# Patient Record
Sex: Male | Born: 1950 | Race: White | Hispanic: No | Marital: Married | Smoking: Current every day smoker
Health system: Southern US, Community
[De-identification: ages and names within clinical notes are randomized; demographics above are authoritative.]

## PROBLEM LIST (undated history)

## (undated) DIAGNOSIS — M109 Gout, unspecified: Secondary | ICD-10-CM

## (undated) DIAGNOSIS — Z973 Presence of spectacles and contact lenses: Secondary | ICD-10-CM

## (undated) DIAGNOSIS — J302 Other seasonal allergic rhinitis: Secondary | ICD-10-CM

## (undated) DIAGNOSIS — E785 Hyperlipidemia, unspecified: Secondary | ICD-10-CM

## (undated) DIAGNOSIS — Z972 Presence of dental prosthetic device (complete) (partial): Secondary | ICD-10-CM

## (undated) DIAGNOSIS — I10 Essential (primary) hypertension: Secondary | ICD-10-CM

## (undated) DIAGNOSIS — G4730 Sleep apnea, unspecified: Secondary | ICD-10-CM

## (undated) DIAGNOSIS — E119 Type 2 diabetes mellitus without complications: Secondary | ICD-10-CM

## (undated) DIAGNOSIS — J449 Chronic obstructive pulmonary disease, unspecified: Secondary | ICD-10-CM

## (undated) DIAGNOSIS — K219 Gastro-esophageal reflux disease without esophagitis: Secondary | ICD-10-CM

## (undated) HISTORY — PX: TONSILLECTOMY: SUR1361

## (undated) HISTORY — DX: Essential (primary) hypertension: I10

## (undated) HISTORY — PX: MENISCUS REPAIR: SHX5179

## (undated) HISTORY — DX: Sleep apnea, unspecified: G47.30

## (undated) HISTORY — DX: Hyperlipidemia, unspecified: E78.5

## (undated) HISTORY — PX: COLONOSCOPY: SHX174

## (undated) HISTORY — DX: Chronic obstructive pulmonary disease, unspecified: J44.9

## (undated) HISTORY — DX: Other seasonal allergic rhinitis: J30.2

## (undated) HISTORY — PX: ERCP: SHX60

## (undated) HISTORY — PX: CARPAL TUNNEL RELEASE: SHX101

## (undated) HISTORY — DX: Type 2 diabetes mellitus without complications: E11.9

---

## 1996-04-13 HISTORY — PX: NASAL SINUS SURGERY: SHX719

## 1997-09-12 ENCOUNTER — Ambulatory Visit (HOSPITAL_BASED_OUTPATIENT_CLINIC_OR_DEPARTMENT_OTHER): Admission: RE | Admit: 1997-09-12 | Discharge: 1997-09-12 | Payer: Self-pay | Admitting: Otolaryngology

## 1998-06-21 ENCOUNTER — Ambulatory Visit: Admission: RE | Admit: 1998-06-21 | Discharge: 1998-06-21 | Payer: Self-pay | Admitting: Rheumatology

## 2000-11-02 ENCOUNTER — Encounter (INDEPENDENT_AMBULATORY_CARE_PROVIDER_SITE_OTHER): Payer: Self-pay | Admitting: Specialist

## 2000-11-02 ENCOUNTER — Other Ambulatory Visit: Admission: RE | Admit: 2000-11-02 | Discharge: 2000-11-02 | Payer: Self-pay | Admitting: Family Medicine

## 2002-01-13 ENCOUNTER — Encounter: Payer: Self-pay | Admitting: Gastroenterology

## 2002-01-13 ENCOUNTER — Encounter: Admission: RE | Admit: 2002-01-13 | Discharge: 2002-01-13 | Payer: Self-pay | Admitting: Gastroenterology

## 2002-01-20 ENCOUNTER — Encounter: Admission: RE | Admit: 2002-01-20 | Discharge: 2002-01-20 | Payer: Self-pay | Admitting: Gastroenterology

## 2002-01-20 ENCOUNTER — Encounter: Payer: Self-pay | Admitting: Gastroenterology

## 2002-01-31 ENCOUNTER — Ambulatory Visit (HOSPITAL_COMMUNITY): Admission: RE | Admit: 2002-01-31 | Discharge: 2002-01-31 | Payer: Self-pay | Admitting: Gastroenterology

## 2002-09-06 ENCOUNTER — Encounter (INDEPENDENT_AMBULATORY_CARE_PROVIDER_SITE_OTHER): Payer: Self-pay | Admitting: *Deleted

## 2002-09-06 ENCOUNTER — Ambulatory Visit (HOSPITAL_COMMUNITY): Admission: RE | Admit: 2002-09-06 | Discharge: 2002-09-06 | Payer: Self-pay | Admitting: Gastroenterology

## 2002-10-06 ENCOUNTER — Ambulatory Visit: Admission: RE | Admit: 2002-10-06 | Discharge: 2002-10-06 | Payer: Self-pay | Admitting: Otolaryngology

## 2005-11-13 ENCOUNTER — Encounter: Admission: RE | Admit: 2005-11-13 | Discharge: 2005-11-13 | Payer: Self-pay | Admitting: Gastroenterology

## 2005-11-20 ENCOUNTER — Encounter (INDEPENDENT_AMBULATORY_CARE_PROVIDER_SITE_OTHER): Payer: Self-pay | Admitting: Specialist

## 2005-11-20 ENCOUNTER — Ambulatory Visit (HOSPITAL_COMMUNITY): Admission: RE | Admit: 2005-11-20 | Discharge: 2005-11-20 | Payer: Self-pay | Admitting: Gastroenterology

## 2006-12-17 ENCOUNTER — Encounter: Admission: RE | Admit: 2006-12-17 | Discharge: 2006-12-17 | Payer: Self-pay | Admitting: Otolaryngology

## 2006-12-30 ENCOUNTER — Ambulatory Visit (HOSPITAL_COMMUNITY): Admission: RE | Admit: 2006-12-30 | Discharge: 2006-12-31 | Payer: Self-pay | Admitting: Otolaryngology

## 2006-12-30 ENCOUNTER — Encounter (INDEPENDENT_AMBULATORY_CARE_PROVIDER_SITE_OTHER): Payer: Self-pay | Admitting: Otolaryngology

## 2007-01-11 ENCOUNTER — Ambulatory Visit: Payer: Self-pay | Admitting: Internal Medicine

## 2007-02-11 ENCOUNTER — Ambulatory Visit: Payer: Self-pay | Admitting: Internal Medicine

## 2007-02-18 ENCOUNTER — Ambulatory Visit: Payer: Self-pay | Admitting: Pulmonary Disease

## 2007-02-19 DIAGNOSIS — E785 Hyperlipidemia, unspecified: Secondary | ICD-10-CM | POA: Insufficient documentation

## 2007-02-19 DIAGNOSIS — I10 Essential (primary) hypertension: Secondary | ICD-10-CM | POA: Insufficient documentation

## 2007-02-19 DIAGNOSIS — E1165 Type 2 diabetes mellitus with hyperglycemia: Secondary | ICD-10-CM | POA: Insufficient documentation

## 2007-02-19 DIAGNOSIS — R0602 Shortness of breath: Secondary | ICD-10-CM | POA: Insufficient documentation

## 2007-02-19 DIAGNOSIS — R059 Cough, unspecified: Secondary | ICD-10-CM | POA: Insufficient documentation

## 2007-02-19 DIAGNOSIS — R05 Cough: Secondary | ICD-10-CM

## 2007-03-08 ENCOUNTER — Telehealth (INDEPENDENT_AMBULATORY_CARE_PROVIDER_SITE_OTHER): Payer: Self-pay | Admitting: *Deleted

## 2007-03-17 ENCOUNTER — Encounter (INDEPENDENT_AMBULATORY_CARE_PROVIDER_SITE_OTHER): Payer: Self-pay | Admitting: Otolaryngology

## 2007-03-17 ENCOUNTER — Observation Stay (HOSPITAL_COMMUNITY): Admission: RE | Admit: 2007-03-17 | Discharge: 2007-03-18 | Payer: Self-pay | Admitting: Otolaryngology

## 2007-09-19 ENCOUNTER — Telehealth (INDEPENDENT_AMBULATORY_CARE_PROVIDER_SITE_OTHER): Payer: Self-pay | Admitting: *Deleted

## 2009-04-13 HISTORY — PX: HEMORRHOID SURGERY: SHX153

## 2009-07-12 ENCOUNTER — Encounter: Admission: RE | Admit: 2009-07-12 | Discharge: 2009-07-12 | Payer: Self-pay | Admitting: General Surgery

## 2009-07-17 ENCOUNTER — Ambulatory Visit (HOSPITAL_BASED_OUTPATIENT_CLINIC_OR_DEPARTMENT_OTHER): Admission: RE | Admit: 2009-07-17 | Discharge: 2009-07-17 | Payer: Self-pay | Admitting: General Surgery

## 2010-04-13 HISTORY — PX: PAROTID GLAND TUMOR EXCISION: SHX5221

## 2010-05-13 NOTE — Progress Notes (Signed)
Summary: [place] w/cxr pt called back please call pt's wife at work   Phone Note [employer] placed by: leslie Call placed to: pt Summary of Call: Aaccording to tickle file, pt is due for rov with cxr.  ATC pt NA LMOMTCB  Initial call taken by: Vernie Murders,  September 19, 2007 12:05 PM  Follow-up for Phone Call        lmtcb x2  Vernie Murders  September 20, 2007 11:15 AM   [phone] [place] - wife please call after 1:00 Follow-up by: Lacinda Axon,  September 20, 2007 12:13 PM  Additional Follow-up for Phone Call Additional follow up Details #1::        Spoke with pt.'s wife [place].  I made her aware that pt. is due for rov with cxr this month.  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 Additional Follow-up by: Vernie Murders,  September 21, 2007 10:05 AM

## 2010-05-13 NOTE — Progress Notes (Signed)
Summary: need face mask  Phone Note Call from Patient Call back at [phone]   Summary of Call: calling about getting face mask Patient's chart has been requested.  . Initial call taken by: Rickard Patience,  March 08, 2007 3:09 PM  Follow-up for Phone Call        ATCB LMOM TCB.  Rx for CPAP mask mailed out on 03/03/07 to Pt.  Spoke with pt's wife aware. Follow-up by: Cloyde Reams,  March 08, 2007 4:27 PM

## 2010-07-02 LAB — GLUCOSE, CAPILLARY
Glucose-Capillary: 113 mg/dL — ABNORMAL HIGH (ref 70–99)
Glucose-Capillary: 123 mg/dL — ABNORMAL HIGH (ref 70–99)

## 2010-07-02 LAB — DIFFERENTIAL
Basophils Absolute: 0.1 10*3/uL (ref 0.0–0.1)
Basophils Relative: 1 % (ref 0–1)
Eosinophils Absolute: 0.2 10*3/uL (ref 0.0–0.7)
Eosinophils Relative: 3 % (ref 0–5)
Lymphocytes Relative: 34 % (ref 12–46)
Lymphs Abs: 2.6 10*3/uL (ref 0.7–4.0)
Monocytes Absolute: 0.6 10*3/uL (ref 0.1–1.0)
Monocytes Relative: 8 % (ref 3–12)
Neutro Abs: 4.1 10*3/uL (ref 1.7–7.7)
Neutrophils Relative %: 54 % (ref 43–77)

## 2010-07-02 LAB — CBC
HCT: 44.4 % (ref 39.0–52.0)
Hemoglobin: 14.8 g/dL (ref 13.0–17.0)
MCHC: 33.3 g/dL (ref 30.0–36.0)
MCV: 89.2 fL (ref 78.0–100.0)
Platelets: 188 10*3/uL (ref 150–400)
RBC: 4.98 MIL/uL (ref 4.22–5.81)
RDW: 13.8 % (ref 11.5–15.5)
WBC: 7.6 10*3/uL (ref 4.0–10.5)

## 2010-07-02 LAB — BASIC METABOLIC PANEL
BUN: 11 mg/dL (ref 6–23)
CO2: 28 mEq/L (ref 19–32)
Calcium: 9.5 mg/dL (ref 8.4–10.5)
Chloride: 101 mEq/L (ref 96–112)
Creatinine, Ser: 0.93 mg/dL (ref 0.4–1.5)
GFR calc Af Amer: >60 mL/min (ref >60)
GFR calc non Af Amer: >60 mL/min (ref >60)
Glucose, Bld: 95 mg/dL (ref 70–99)
Potassium: 4.4 mEq/L (ref 3.5–5.1)
Sodium: 136 mEq/L (ref 135–145)

## 2010-08-26 NOTE — Assessment & Plan Note (Signed)
[facility]                             PULMONARY OFFICE NOTE   NAME:Andrew Mercer, Pat                      MRN:          [id number]  DATE:02/18/2007                            DOB:          [birth year]    HISTORY OF PRESENT ILLNESS:  The patient is a very pleasant 60 year old  gentleman who I have been asked to see for management of obstructive  sleep apnea. The patient underwent a sleep study in 2004 that was a  split night study, where he was found to have an apnea hypopnea index of  9 events per hour with O2 desaturation as low as 86%. The patient was  ultimately placed on CPAP and titrated to 6 centimeters with good  control of his obstructive sleep apnea. The patient has been on CPAP  ever since and recently underwent an auto-study with [employer] and believes  that his machine is now set on between 10 and 12. He uses a nasal mask,  as well as heated humidification. He just recently got a new CPAP  machine. 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 He drives only  locally with 500 miles per day being his maximum. The patient definitely  feels that CPAP has helped him tremendously and has very minimal  inappropriate daytime sleepiness with periods of inactivity. This  primarily occurs with watching TV or movies. He denies any sleepiness  when driving. He has no snoring according to his wife and only opens his  mouth on rare occasions. Of note, the patient's weight is up about 20  pounds over the last five years.   PAST MEDICAL HISTORY:  1. Significant for hypertension.  2. Diabetes.  3. Dyslipidemia.  4. History of sleep apnea as stated above.  5. History of parotid surgery.  6. History of sinus surgery in 1999.   CURRENT MEDICATIONS:  1. Metformin 500 mg b.i.d.  2. Amlodipine 10 mg daily.  3. Lovaza 1000 mg q.i.d.  4. Cozaar 50 mg daily.  5. Avandia 4 mg daily.  6.  Zetia 10 mg daily.  7. Protonix 40 mg daily.  8. Xyzal 5 mg daily.  9. Mucinex p.r.n.   ALLERGIES:  The patient has questionable IODINE allergy. He is allergic  to CLAMS.   SOCIAL HISTORY:  He is married and does not have children. He has a  history of smoking since he was 60 years old and currently is smoking a  pack and a half per day.   FAMILY HISTORY:  Remarkable for his mother having asthma, as well as  arthritis.   REVIEW OF SYSTEMS:  As per history of present illness, also see patient  intake form documented on the chart.   PHYSICAL EXAMINATION:  GENERAL:  He is a morbidly obese male in no acute  distress.  VITAL SIGNS:  Blood pressure 130/82, pulse 71, temperature 98.2, weight  339 pounds. He is 5 feet 11 inches tall. O2 saturation on room air is  97%.  HEENT:  Pupils equal, round, and reactive to light and accommodation,  extraocular muscles are intact, nares are patent without discharge.  Oropharynx does show moderate elongation of the soft palette and uvula  with some soft tissue redundancy posteriorly.  NECK:  Supple without JVD or lymphadenopathy. There is no palpable  thyromegaly.  CHEST:  Reveals coarse rhonchi throughout, but no wheezes.  CARDIAC:  Regular rate and rhythm.  ABDOMEN:  Soft and nontender with good bowel sounds.  GENITAL:  Not done and not indicated.  RECTAL:  Not done and not indicated.  BREASTS:  Not done and not indicated.  LOWER EXTREMITIES:  With trace edema. Pulses are intact distally.  NEUROLOGIC:  Alert and oriented with no obvious observable motor  defects.   IMPRESSION:  History of mild obstructive sleep apnea. I suspect that the  patient's severity has increased from that study. At least he has had a  recent auto-titrate done and his pressure has been readjusted. I will  need to get a copy of that and verify that he is on the appropriate  pressure. The patient feels that he is doing extremely well with the  CPAP and is having no  difficulties. I have encouraged him to continue a  very high level of compliance with CPAP and work aggressively on weight  loss.   PLAN:  1. We will get recent auto-titrate study from [employer], as well as what      type of machine he is on and his current pressure setting.  2. Work aggressively on weight loss.  3. The patient will follow up in one year or sooner if he is having      problems.     Barbaraann Share, MD,FCCP  Electronically Signed    [facility]/MedQ  DD: 02/18/2007  DT: 02/19/2007  Job #: [id number]   cc:   Charlaine Dalton. Sherene Sires, MD, [facility]  [facility]  Hermelinda Medicus, M.D.

## 2010-08-26 NOTE — H&P (Signed)
NAME:  Andrew Mercer, Andrew Mercer NO.:  [id number]   MEDICAL RECORD NO.:  [id number]          PATIENT TYPE:  OIB   LOCATION:  2550                         FACILITY:  [facility]   PHYSICIAN:  Hermelinda Medicus, M.D.   DATE OF BIRTH:  [birth year]   DATE OF ADMISSION:  12/30/2006  DATE OF DISCHARGE:                              HISTORY & PHYSICAL   This patient is a 60 year old male who has been under my care in the  past for sinus issues, but came in with a right parotid mass where a CAT  scan was obtained after no response to antibiotics, showing a large  cystic-type mass of the right parotid and a serocystic mass to the left  carotid as well as some diffuse cervical nodes.  His chest x-ray showed  no active lung disease though he did have a possible mild chronic  interstitial change.  He has not had any pain in the past and did have  that worrisome about vascular issues so we also got a right ultrasound  which showed a 6.4 cm x 3.4 cm x 3.5 cm mass.  However, it is bilobular  in the right side intraparotid felt to be superficial and deep, but no  venous lake or vascular structure was noted.  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  He now enters with  that diagnosis.   His past history is one of having had sinus surgery in 1999.  He is very  heavy man of 320 pounds.  He is a smoker of one pack a day.  He has had  carpal tunnel surgery in the past as well as a torn meniscus in his  knee.  He has had a stress test by Dr. Ronny Flurry that he has been  cleared with as far as Cardiolite evaluation.  Myocardial perfusion was  excellent and he came up with a normal stress nuclear study.  He works  as a Naval architect.  He does have hyperlipidemia, hypertension, but no  pulmonary problems.  He has a hiatal hernia with reflux.  He does also  have history of diabetes and takes oral agents and tries to limited  diet.  His allergies of that of STATINS,  CLINDAMYCIN and SHELLFISH.  He  is on CPAP for sleep apnea.   PHYSICAL EXAMINATION:  His blood pressure is 148/95.  His O2 saturation  was 96 and his pulse was 78 with respirations of 18.  His weight is 152  kg, height is 69 inches.  His ears are clear, tympanic membranes are  clear.  The oral cavity is also clear of any ulceration or mass.  The  nose is clear.  He uses CPAP.  His right carotid, however, shows a  rather firm but soft, nonadherent large mass as described on this CAT  scan that appears to be in the more inferior aspect of the parotid.  No  neck nodes can be palpated but we can see them on CAT scan and he has  just got a very full neck.  CHEST:  Clear.  No rales, rhonchi or wheezes.  CARDIOVASCULAR:  No opening snaps, murmurs or gallops.  ABDOMEN:  Very obese.  He is 320 pounds.  EXTREMITIES:  He has had the previous knee surgery as well as carpal  tunnel surgery.   INITIAL DIAGNOSES:  1. 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  Our plan is to do a right superficial and probably      deep lobe parotidectomy with a frozen section.  2. Obesity.  3. Diabetes.  4. History of carpal tunnel bilateral.  5. History of knee surgery.  6. Hypercholesterolemia.  7. Allergy to CLINDAMYCIN, STATINS and SHELLFISH.           ______________________________  Hermelinda Medicus, M.D.     JC/MEDQ  D:  12/30/2006  T:  12/30/2006  Job:  [id number]   cc:   Cassell Clement, M.D.  [name], MD

## 2010-08-26 NOTE — Assessment & Plan Note (Signed)
[facility]                             PULMONARY OFFICE NOTE   NAME:BLOUIRJaveion, Andrew Mercer                        MRN:          [id number]  DATE:01/11/2007                            DOB:          05-Jul-1950    CHIEF COMPLAINT:  Cough and dyspnea.   HISTORY:  A 60 year old white male active smoker with refractory  symptoms of cough and dyspnea that developed since he underwent right  carotid surgery about 2 weeks ago.  Preoperatively, he was told that his  lung function was only 60% of predicted and was told to take Advair,  but actually did not start it until postoperatively.  Within 24 hours he  began having a hacking cough and then added Advair, and says if  anything, the Advair made it worse.  The cough is more dry than wet and  more day than night time in nature.  He also has significant dyspnea  with exertion, which is both a chronic problem, but worse since the  cough started.  He has no trouble with slow ADLs, nor significant  nocturnal exacerbation of cough, wheezing, dyspnea, chest pain, purulent  nasal secretions, overt reflux symptoms, or dysphagia.   PAST MEDICAL HISTORY:  Significant for hypertension, for which he is on  ARB therapy in the form of Cozaar.  He also has type 2 diabetes,  hyperlipidemia, and underwent sinus surgery in 2004, as well as  parotidectomy 2 weeks ago.   ALLERGIES:  He reports he is allergic to [facility] DRUGS, but he is on  simvastatin.   MEDICATIONS:  Norvasc.  Zocor.  Allopurinol.  Citalopram.  Lexapro (not  clear that he knows this is the same drug).  Fish oil.  Calcium.  Multivite.  Along with p.r.n. ProAir, which he says does no good.   SOCIAL HISTORY:  He continues to smoke a pack-and-a-half a day and has  done so for 30 years.  He works as a Naval architect.   FAMILY HISTORY:  Taken in detail and significant for asthma in his  mother.  Allergies in multiple siblings, mother, and father.   REVIEW OF SYSTEMS:   Taken in detail on the worksheet and negative,  except as outlined above.   PHYSICAL EXAMINATION:  This is a pleasant ambulatory obese white male in  no acute distress.  VITAL SIGNS:  Stable vital signs.  HEENT:  Unremarkable.  Oropharynx is clear.  He does have upper dentures  in place.  Lower dentition is intact.  He has moderate nonspecific  turbinate edema and is mildly hoarse.  NECK:  Supple without cervical adenopathy or tenderness.  He has a well-  healed parotid scar.  Ear canals are clear bilaterally.  Carotid  upstrokes are brisk with no bruits.  LUNGS:  The lung fields are completely clear bilaterally to auscultation  and percussion with no cough elicited on inspiratory or expiratory  maneuvers.  CARDIAC:  Regular rate and rhythm without murmur, gallop, or rub.  ABDOMEN:  Obese, but otherwise benign.  EXTREMITIES:  Warm without calf tenderness, cyanosis, clubbing, or  edema.  We walked him around the office 3 laps rapidly, 185 feet each.  His  saturation remained in the mid 90s with a peak heart rate of 138.   CT scan of the chest was reviewed form November 06, 2006 indicating linear  atelectatic changes only.   IMPRESSION:  Cough is markedly disproportionate to objective findings,  as is his complaint of dyspnea, which we really could not reproduce in  the office.  I suspect this is upper airway, therefore, in nature,  especially since it does not wake him from his sleep, as would be  typical of sinus disease or asthma.   I therefore recommended the following.  1. Stop Advair since in fact the dry powder may exacerbate upper      airway irritation syndromes, such as reflux.  2. Treat empirically for reflux with Protonix 40 mg b.i.d. for the      next 2 weeks before returning for followup.  3. Treat for 6 days acutely with prednisone.  4. I reviewed the need to use cough suppression in the form of      tramadol 50 mg every 4 hours p.r.n. and to avoid anything that       might exacerbate GERD, including all tobacco products, mint, and      menthol products.   Finally, because we have seen anecdotally, a few patients for some  unusual reason who cough on Cozaar, but do not cough on other types of  ARB therapy, I have recommended a trial of Tekturna 150 mg 1 daily, the  new renin inhibitor, which theoretically could not possibly cause a  cough via an ACE inhibitor mechanism since it would not interfere with  bradykinin metabolism.     Andrew Mercer. Andrew Sires, MD, [facility]  Electronically Signed    MBW/MedQ  DD: 01/11/2007  DT: 01/12/2007  Job #: [id number]   cc:   Hermelinda Medicus, M.D.  Gloriajean Dell. Andrey Campanile, M.D.

## 2010-08-26 NOTE — H&P (Signed)
NAME:  Andrew Mercer, Andrew Mercer NO.:  [id number]   MEDICAL RECORD NO.:  [id number]          PATIENT TYPE:  OBV   LOCATION:  3309                         FACILITY:  [facility]   PHYSICIAN:  Hermelinda Medicus, M.D.   DATE OF BIRTH:  03/26/1951   DATE OF ADMISSION:  03/17/2007  DATE OF DISCHARGE:                              HISTORY & PHYSICAL   This patient is a 60 year old male who has been under my care for sinus  infections in the past, and he has had an obvious right parotid mass  where a CT scan was obtained which showed not only a 5 cm mass on the  right side, but a bilobed 3.3 cm mass on the left.  It was a serocystic-  type mass and we felt that this right side needed to be removed at least  first.  His chest x-ray showed no active lung disease, but he does have  some mild chronic interstitial change.  He also has had some bronchitis  in the past and was treated for this and cleared by Dr. Marcelyn Bruins and  Dr. Sandrea Hughs.  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  He could feel the  pressure in that parotid system although it was not quite as big, this  was 3.3, but it was bilobed in nature.  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   His past history is having had sinus surgery in 1995.  He is a very  heavy man that weighs approximately 320 to 340.  He is a smoker of one  pack a day.  He has had carpal tunnel surgery and also has had torn  meniscus in his knee.  He has had a stress test under Dr. Ronny Flurry  and is cleared as far as his Cardiolite evaluation.  His myocardial  perfusion was excellent, came out with a normal stress test nuclear  study.  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  He does  have hyperlipidemia and hypertension with no pulmonary problems.  He  does have also a hiatal hernia history and also has a history of  diabetes and takes oral agents for this.  HE IS ALLERGIC TO STATINS,  CLINDAMYCIN AND SHELLFISH, and with his heavy weight he is on CPAP.   His medications are listed in the chart which are:  1. Lovaza 1 gm twice a day.  2. Pantoprazole 40 mg daily.  3. Amlodipine Besylate 10 mg daily.  4. Cozaar 50 mg daily.  5. Xyzal 5 mg daily.  6. Zetia 10 daily.  7. Metformin HCL 500 mg twice a day.  8. Avandia 4 mg per day.  9. Multivitamins.  10.Aspirin; the aspirin has been discontinued for this  in preparation      for the surgery.   VITAL SIGNS:  His blood pressure is well under control at 125/77 with a  pulse ox of 95 and a blood sugar of 124 this morning, and a respiration  rate of  64.  HEENT:  His ears are clear.  Tympanic membranes are clear.  The right  parotid area where he had previous surgery has healed very nicely; the  left shows the fullness and mass and of course this is seen also on CT  scan to be a 3.3 cm mass and it appears to have a bilobed element to it,  however no neck nodes were seen.  His oral cavity is clear.  His larynx  is clear.  True cords, false cords, epiglottis, face and tongue are  clear of ulceration or mass.  True cord mobility and gag reflex normal.  EOMs and facial nerve are all symmetrical.  NECK:  His neck is free of any thyromegaly or cervical adenopathy, just  the left parotid mass and the right history of parotid surgery.  CHEST:  Clear.  No rales, rhonchi or wheezes.  Increased AP diameter.  Mildly decreased breath sounds.  CARDIOVASCULAR EVALUATION:  EKG showed normal sinus rhythm with PACs.  He does have some gastroesophageal reflux.  He does have the sleep apnea  history and he does smoke 1 1/2 packs a day for 40 years.  EXTREMITIES:  Unremarkable except he has had the previous  orthopedic  issues.   DIAGNOSES:  1. He now enters for a left parotid superficial parotidectomy.      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  2. Sleep apnea.  3. Obesity.  4. Hypercholesterolemia.  5. Diabetes non-insulin type 2.  6. History of carpal tunnel surgery.  7. History of knee surgery.  8. HE ALSO HAS AN ALLERGY TO CLINDAMYCIN, STATINS AND SHELLFISH.           ______________________________  Hermelinda Medicus, M.D.     JC/MEDQ  D:  03/17/2007  T:  03/17/2007  Job:  [id number]   cc:   Cassell Clement, M.D.  [name], MD

## 2010-08-26 NOTE — Op Note (Signed)
[name], [name]               ACCOUNT NO.:  [id number]   MEDICAL RECORD NO.:  [id number]          PATIENT TYPE:  OIB   LOCATION:  3305                         FACILITY:  [facility]   PHYSICIAN:  Hermelinda Medicus, M.D.   DATE OF BIRTH:  28-Jan-1951   DATE OF PROCEDURE:  DATE OF DISCHARGE:                               OPERATIVE REPORT   PREOPERATIVE DIAGNOSIS:  Right parotid mass, deep and superficial lobe  with associated neck nodes with a left parotid mass.   POSTOPERATIVE DIAGNOSIS:  Right parotid mass, deep and superficial lobe  with associated neck nodes with a left parotid mass.   OPERATION:  A right superficial and deep parotidectomy.   SURGEON:  Hermelinda Medicus, M.D.   ANESTHESIA:  General endotracheal with Dr. Gypsy Balsam.   ASSISTANT:  Dr. Narda Bonds.   PROCEDURE:  The patient was placed in the supine position.  Under  general endotracheal anesthesia, the head was turned to the left, the  right parotid being exposed.  We prepped with Betadine x3 and then the  usual flexible see-through drape was used to monitor the fascial nerve  function and also the usual parotid drape was completed.  Once this was  completed, a preauricular then inferior to the lobe of the ear down on  the neck incision was made using the Bard-Parker blade and the Bovie  electrocoagulation, carried down to the parotid gland and the anterior  dissection was carried out elevating the flap of the anterior facial  skin at the parotid level.  Then we dissected posteriorly down along the  sternocleidomastoid muscle and the scalenus muscles and then down to the  mastoid tip and then back along the cartilaginous and bony ear canal,  found the pointer of the tympanic bone and then we found the styloid  process and isolated the facial nerve.  The facial nerve was being  pushed somewhat lateral toward [place], therefore, we were suspicious that we  had some deep lobe involvement also as well as superficial and primarily  the mass was more in the inferior aspect of the parotid.  We dissected  out the facial nerve and then concentrated more on the inferior  branches.  The superior branch going to the eye and frontal region was  set aside.  We dissected out further branches right out to the anterior  aspect of the parotid.  We then had to work our way down further to the  deeper lobe involvement and dissect the facial nerve once again,  branches off the deeper lobe beneath the facial nerve and then were able  to sweep this entire parotid mass inferiorly.  We cultured a purulent  drainage area that we met up with and anaerobic and aerobic and also got  a frozen section which came up with just parotid and some inflammation.  No lymphoma was seen. We continued our dissection, freeing up the entire  facial nerve and all hemostasis was established with Bovie  electrocoagulation, bipolar coagulation and with the 2-0 and 3-0 silk  ties.  We also tested the facial nerve with the facial nerve monitor on  several occasions as it was stretched over certain areas of the tumor,  making it somewhat thin and a little bit more difficult to identify.  All branches were saved and the dissection was carried out, removing the  mass completely.  Once this was completed, all hemostasis was  established with bipolar cautery and the Bovie electrocoagulation and  then a 7 fluted flat Jackson-Pratt was placed on the inferior aspect to  the incisional site into the parotid cavity region and closure was begun  using 2-0 chromic catgut and then 5-0 Ethilon was used on the  preauricular skin and skin clips were used on the neck.  The Al Pimple was then stabilized.  The blood loss was estimated at 50 to 75 mL  and the patient tolerated the procedure very  well and is doing very well postoperatively.  He will be monitored after  surgery with a CPAP and 3300 as he does have sleep apnea.  He is aware  of the risks and gains here; the  risk to his facial nerve branches and  the fact that this diagnosis could be benign or malignant and he will be  followed by his medical physicians as well as myself.           ______________________________  Hermelinda Medicus, M.D.     JC/MEDQ  D:  12/30/2006  T:  12/31/2006  Job:  [id number]   cc:   [name], MD  Cassell Clement, M.D.  Kristine Garbe. Ezzard Standing, M.D.

## 2010-08-26 NOTE — Op Note (Signed)
Andrew Mercer, Andrew Mercer NO.:  [id number]   MEDICAL RECORD NO.:  [id number]          PATIENT TYPE:  OBV   LOCATION:  3309                         FACILITY:  [facility]   PHYSICIAN:  Hermelinda Medicus, M.D.   DATE OF BIRTH:  29-May-1950   DATE OF PROCEDURE:  DATE OF DISCHARGE:                               OPERATIVE REPORT   The patient has a left parotid mass.  He has already has surgery on his  right parotid, is aware of the risks and gains of the potential risks of  the facial nerve as well as the risk of surgery.  He did have some  pulmonary issues postoperatively on the left surgery and we will be  watching for this carefully now.  He also has CPAP and will be kept in  the stepdown for pulmonary control and his sleep apnea control.  He is  also diabetic, not using insulin type 2.   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   OPERATION:  Left superficial parotidectomy.   OPERATOR:  Hermelinda Medicus, M.D.   ASSISTANT:  Kristine Garbe. Ezzard Standing, M.D.   ANESTHESIA:  General endotracheal with Dr. Michelle Piper.   PROCEDURE:  The patient placed in the supine position.  Under general  endotracheal anesthesia, the patient was positioned and then prepped and  draped using the Betadine in the usual parotid drape.  A preauricular  incision was made, carried down through the skin to the parotid mass and  the parotid gland.  The dissection was carried inferiorly, separating  the parotid tissues from the musculature and then the dissection was  continued anteriorly, elevating the skin flap over the parotid gland.  The key area around the cartilage of the external ear canal and then the  tympanic cartilage using a pointer and then the styloid was also used as  our locator for the facial nerve.  The dissection was  carried down in  this region and the facial nerve was found and guarded very carefully.  It was tested using the small facial nerve testing procedure and found  to be in good condition.  The tumor, however, was sitting right on the  facial nerve right at its bifurcation and this was somewhat difficult  and it has had some scarring around this.  We, however, were able to  dissect this free inferiorly.  We worked our way down through the  mandibular maxillary branches and dissected the tumor which was quite  large and extended down in between the two major branches of the facial  nerve and this was dissected free from the deepest lobe of the parotid.  Superiorly, we then dissected the tumor from these branches of the  frontal and ophthalmic and maxillary as well and the lesion was first  dissected off of the nerve and then the nerve as the lesion went deeper  into the deeper portion of the parotid, was dissected  off the lesion.  The lesion was felt to be a Warthin's tumor once again.  A node was seen  that was taken and will be included in this mass.  Dissection was  carried out.  The hemostasis was established with Bovie  electrocoagulation, 2-0 ties and bipolar cautery set at 14.  Once the  tumor was removed, all hemostasis was checked, the area was irrigated  and again checked for any bleeding.  The Jackson-Pratt drain was placed  using the fluted flat #7 and the closure was begun using chromic catgut  3-0 and then 5-0 Ethilon.  Once this closure was completed, the patient  was awakened.  He did have weakness around the frontal and around the  maxillary branches but I felt this is something that is just weakness  from the dissection, trying to get the tumor off the nerve directly.  I  firmly feel that this will recover.  Mandibular branch is working well  as well as the facial function around the eye.  A small 4 x 4 dressing  was placed and the patient has tolerated, doing very well, and  will be  in stepdown for observation for a CPAP and also for his diabetes and the  fact that he has had some respiratory issues in the past in his recovery  phase.   Followup will then be in 5 days, in 10 days, 3 weeks, 5 weeks, 2 months,  4 months, 6 months and 1 year.           ______________________________  Hermelinda Medicus, M.D.     JC/MEDQ  D:  03/17/2007  T:  03/17/2007  Job:  [id number]   cc:   Cassell Clement, M.D.  [name], MD  Kristine Garbe. Ezzard Standing, M.D.

## 2010-08-26 NOTE — Assessment & Plan Note (Signed)
Andrew Mercer                             PULMONARY OFFICE NOTE   NAME:Andrew Mercer, Andrew Mercer                      MRN:          [id number]  DATE:02/11/2007                            DOB:          May 08, 1950    EXTENDED SUMMARY AND FINAL FOLLOWUP OFFICE VISIT:   HISTORY OF PRESENT ILLNESS:  A 60 year old white male with multiple  pulmonary issues.  He was initially seen on September 30 with coughing  and dyspnea that I thought were aggravated by Advair because of upper  airway issues.  I recommended increasing Protonix until better and then  reducing the dose back down to 1 daily and stopping Advair.  He found  that this was effective.  I was also concerned about the possibility  that Cozaar was causing an upper airway syndrome similar to ACE  inhibitors, and he stopped this transiently but is now back on it with  no exacerbation.   Presently he is short of breath only when he walks fast, not when he  walks at a normal pace.  He denies any exertional chest pain, orthopnea,  PND, or leg swelling.   PHYSICAL EXAMINATION:  GENERAL:  He is an obese, ambulatory white man in  no acute distress.  VITAL SIGNS:  Stable except for blood pressure of 150/90.  HEENT:  Unremarkable.  Oropharynx clear.  LUNGS:  Lung fields are clear bilaterally to percussion.  HEART:  Regular rate and rhythm without murmur, gallop, or rub.  ABDOMEN:  Soft, benign.  EXTREMITIES: Warm without calf tenderness, cyanosis, clubbing, or edema.   PFTs were reviewed with the patient today.  He had an FEV1 of 76%  predicted with a ratio of 73%.  The only abnormality was slight  truncation of the inspiratory loop and an expiratory reserve volume of  31%.   Chest x-ray is reviewed and reveals no definite residual infiltrates.  (Previously he had right middle lobe atelectasis on study dated  January 11, 2007.)   Chart review also indicated that he has documented sleep apnea and has  never  been seen by a sleep specialist.   IMPRESSION:  1. 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  I      have strongly advised that he stop smoking for this reason.  2. However, he does not have significant chronic obstructive pulmonary      disease.  Rather, the most impressive finding on PFTs is one of      disproportionate reduction in expiratory reserve volume consistent      with the effects of obesity.  3. Morbid obesity with target weight of 209 pounds.  I have taken the      liberty of reviewing calorie balance issues with him in the form of      a calorie balance sheet which I would like him to review and      discuss with Dr.  Wilson on his next visit.  I am assuming that his      TSH has been done at some time in the past but would need to be      checked at some point if improving caloric balance does not result      in weight loss of about 1 pound per week.  4. Obstructive sleep apnea is another complication of obesity and      should be evaluated by one of our sleep specialists to make sure      that he is getting optimal  pressures because this should translate      into better daytime function and hopefully again better calorie      balance as his energy level improves from better sleep quality and      quantity.  5. Pulmonary followup, however, can be on a p.r.n. basis.     Andrew Mercer. Sherene Sires, MD, [facility]  Electronically Signed    MBW/MedQ  DD: 02/11/2007  DT: 02/12/2007  Job #: [id number]

## 2010-08-29 NOTE — Op Note (Signed)
NAME:  Andrew Mercer, Andrew Mercer               ACCOUNT NO.:  [id number]   MEDICAL RECORD NO.:  [id number]          PATIENT TYPE:  AMB   LOCATION:  ENDO                         FACILITY:  [facility]   PHYSICIAN:  Anselmo Rod, M.D.  DATE OF BIRTH:  31-May-1950   DATE OF PROCEDURE:  DATE OF DISCHARGE:  11/20/2005                                 OPERATIVE REPORT   Audio too short to transcribe (less than 5 seconds)      Anselmo Rod, M.D.     JNM/MEDQ  D:  11/23/2005  T:  11/23/2005  Job:  [id number]

## 2010-08-29 NOTE — Op Note (Signed)
NAME:  Andrew Mercer, Andrew Mercer                         ACCOUNT NO.:  [id number]   MEDICAL RECORD NO.:  [id number]                   PATIENT TYPE:  AMB   LOCATION:  ENDO                                 FACILITY:  [facility]   PHYSICIAN:  Anselmo Rod, M.D.               DATE OF BIRTH:  09-Aug-1950   DATE OF PROCEDURE:  09/06/2002  DATE OF DISCHARGE:                                 OPERATIVE REPORT   PROCEDURE:  Esophagogastroduodenoscopy with biopsy.   ENDOSCOPIST:  Anselmo Rod, M.D.   INSTRUMENT USED:  Olympus video panendoscope.   INDICATIONS FOR PROCEDURE:  A 60 year old white male with a history of  epigastric pain not responding to PPI.  The patient has recently  discontinued his Nexium because of dizziness and worsening abdominal  discomfort.  Rule out peptic ulcer disease, esophagitis, gastritis, etc.   PREPROCEDURE PREPARATION:  Informed consent was secured from the patient.  The patient had fasted for eight hours prior to the procedure.   PREPROCEDURE PHYSICAL:  The patient has stable vital signs.  Neck supple.  Chest clear to auscultation.  Heart S1, S2, regular.  Abdomen morbidly obese  with epigastric tenderness on palpation with guarding.  No rebound or  rigidity.  No hepatosplenomegaly.   DESCRIPTION OF PROCEDURE:  The patient was placed in the left lateral  decubitus position and sedated with 70 mg of Demerol and 7 mg of Versed  intravenously.  Once the patient was adequately sedated and maintained on  low-flow oxygen and continuous cardiac monitoring the Olympus video  panendoscope was advanced from the mouth over the tongue into the esophagus  and under direct vision the entire esophagus appeared normal with no  evidence of ring stricture, masses, esophagitis or Barrett's mucosa.  The  scope was then advanced into the stomach.  Retroflexion of the high cardia  revealed no abnormalities.  There was mild diffuse gastritis throughout the  gastric mucosa.  Superficial  antral ulcers were seen (three).  Biopsies were  done to rule out presence of Helicobacter pylori by pathology.  The duodenal  bulb and the proximal small bowel distal duodenal bulb appeared normal.  There was no obstruction.  The patient tolerated the procedure well without  complications.   IMPRESSION:  1. Superficial antral ulcers.  2. Mild diffuse gastritis.  3. Normal appearing esophagus and proximal small bowel.   RECOMMENDATIONS:  1. Await pathology results.  2. Try an alternate PPI.  3.     Treatment with antibiotics if Helicobacter pylori present on biopsies.  4. Avoid all nonsteroidals including aspirin.  5. Outpatient follow up in the next two weeks for further recommendations.  Anselmo Rod, M.D.    JNM/MEDQ  D:  09/06/2002  T:  09/06/2002  Job:  [id number]   cc:   Teena Irani. Arlyce Dice, M.D.  [address]  [place]  [place] [address]  Fax: [phone]

## 2010-08-29 NOTE — Op Note (Signed)
NAME:  Andrew Mercer, Andrew Mercer                         ACCOUNT NO.:  [id number]   MEDICAL RECORD NO.:  [id number]                   PATIENT TYPE:  AMB   LOCATION:  ENDO                                 FACILITY:  [facility]   PHYSICIAN:  Anselmo Rod, M.D.               DATE OF BIRTH:  05/30/1950   DATE OF PROCEDURE:  01/31/2002  DATE OF DISCHARGE:                                 OPERATIVE REPORT   PROCEDURE:  Screening colonoscopy.   ENDOSCOPIST:  Anselmo Rod, M.D.   INSTRUMENT USED:  Olympus video colonoscope.   INDICATIONS FOR PROCEDURE:  Abdominal pain with a personal history of polyps  and an abnormal CT scan in a 60 year old white male.  Rule out recurrent  polyps.  There is evidence of some narrowing of the descending colon, and a  recent CT ruled out colitis or malignancy.   PRE-PROCEDURE PREPARATION:  An informed consent was procured from the  patient.  The patient fasted for eight hours prior to the procedure, and was  prepped with a bottle of magnesium citrate and one gallon of NuLytely on the  night prior to the procedure.   PRE-PROCEDURE PHYSICAL EXAM:  VITAL SIGNS:  Stable vital signs.  LUNGS:  Clear to auscultation.  HEART:  S1, S2.  ABDOMEN:  Soft with normal bowel sounds.  No masses palpable.  The abdomen  is obese, with no evidence of hepatosplenomegaly.   DESCRIPTION OF PROCEDURE:  The patient is placed in the left lateral  decubitus position and sedated with 100 mg of Demerol and 10 mg of Versed  intravenously.  Once the patient was adequately sedated and maintained on  low-flow oxygen and continuous cardiac monitoring, the Olympus video  colonoscope was advanced into the rectum to the cecum and terminal ileum  without difficulty.  The entire colonic mucosa appeared healthy with normal  vascular pattern, except for a few scattered diverticula in the left colon.  The procedure was completed up to the terminal ileum.  The appendiceal  orifice and the ileocecal  valve were clearly visualized and photographed.   IMPRESSION:  1. Healthy-appearing colon up to the terminal ileum except for a few left-     sided diverticula.  2. No masses or polyps seen.  3. No evidence of narrowing of the transverse or descending colon.   RECOMMENDATIONS:  1. Avoid lactulose.  2. Use stool softeners as needed.  3. A high fiber diet with liberal fluid intake.  4.     Considering the patient's previous history of polyps, a repeat colorectal     cancer screening is recommended in the next five years, unless the     patient develops any abnormal symptoms in the interim.  5. Outpatient followup in the next two weeks, or earlier if need be.  Anselmo Rod, M.D.    JNM/MEDQ  D:  01/31/2002  T:  01/31/2002  Job:  [id number]   cc:   Teena Irani. Arlyce Dice, M.D.  [address]  [place]  [place] [address]  Fax: [phone]

## 2010-08-29 NOTE — Op Note (Signed)
NAME:  Andrew Mercer, Andrew Mercer               ACCOUNT NO.:  [id number]   MEDICAL RECORD NO.:  [id number]          PATIENT TYPE:  AMB   LOCATION:  ENDO                         FACILITY:  [facility]   PHYSICIAN:  Anselmo Rod, M.D.  DATE OF BIRTH:  Dec 21, 1950   DATE OF PROCEDURE:  11/20/2005  DATE OF DISCHARGE:  11/20/2005                                 OPERATIVE REPORT   PROCEDURE:  Esophagogastroduodenoscopy with multiple core biopsies.   ENDOSCOPIST:  Anselmo Rod, M.D.   INSTRUMENT USED:  Olympus video panendoscope.   INDICATIONS FOR PROCEDURE:  A 60 year old white male with a history of  severe abdominal pain, rule out peptic ulcer disease, esophagitis,  gastritis, etc. The pain is predominantly in the left upper quadrant in the  epigastric area.   PREPROCEDURE PREPARATION:  Informed consent was procured from the patient.  The patient fasted for eight hours prior to the procedure. The risks and  benefits of the procedure were discussed with the patient in great detail.   PREPROCEDURE PHYSICAL:  The patient had stable vital signs. Neck supple.  Chest clear to auscultation. S1, S2 regular. Abdomen soft with normal bowel  sounds.   DESCRIPTION OF PROCEDURE:  The patient was placed in the left lateral  decubitus position and sedated with 100 mcg of fentanyl and 10 mg of Versed  in slow incremental doses. Once the patient was adequately sedated and  maintained on low flow oxygen and continuous cardiac monitoring, the Olympus  video panendoscope was advanced through the mouthpiece over the tongue into  the esophagus under direct vision. Small flat polypoid lesions were biopsied  from the esophagus, question papilloma versus glycogenic acanthosis. Diffuse  gastritis was noted on advancing the scope in the stomach. Biopsies were  done to rule out the presence of H. pylori by pathology. No ulcers,  erosions, masses or polyps were identified. Retroflexed in the high cardia  revealed no  abnormalities. The proximal small bowel was normal. No  esophageal stricture, Barrett's mucosa or mass were noted in the esophagus.  The patient tolerated the procedure without complications.   IMPRESSION:  1.Flat lesions biopsied from the esophagus, question papilloma  versus glycogenic acanthosis.  2.Diffuse gastritis biopsy is done for H. pylori.  3.Normal proximal small bowel.   RECOMMENDATIONS:  1.Await pathology results.  2.Avoid all nonsteroidals including aspirin for now.  3.Trial of Xifaxan 200 mg 2 pills 3 times a day for the next 10 days. Been  advised this has been called into his pharmacy, [facility] in [place]. Further  recommendations will be made in followup.      Anselmo Rod, M.D.  Electronically Signed     JNM/MEDQ  D:  11/23/2005  T:  11/23/2005  Job:  [id number]   cc:   Teena Irani. Arlyce Dice, M.D.

## 2011-01-19 LAB — URINALYSIS, ROUTINE W REFLEX MICROSCOPIC
Bilirubin Urine: NEGATIVE
Glucose, UA: NEGATIVE
Hgb urine dipstick: NEGATIVE
Ketones, ur: NEGATIVE
Nitrite: NEGATIVE
Protein, ur: NEGATIVE
Specific Gravity, Urine: 1.02
Urobilinogen, UA: 0.2
pH: 6.5

## 2011-01-19 LAB — CBC
HCT: 43.1
Hemoglobin: 14.7
MCHC: 34.1
MCV: 87.1
Platelets: 214
RBC: 4.95
RDW: 13.8
WBC: 7.7

## 2011-01-19 LAB — BASIC METABOLIC PANEL
BUN: 14
CO2: 27
Calcium: 9.7
Chloride: 101
Creatinine, Ser: 0.88
GFR calc Af Amer: >60
GFR calc non Af Amer: >60
Glucose, Bld: 108 — ABNORMAL HIGH
Potassium: 4.6
Sodium: 136

## 2011-01-22 LAB — URINALYSIS, ROUTINE W REFLEX MICROSCOPIC
Bilirubin Urine: NEGATIVE
Glucose, UA: NEGATIVE
Hgb urine dipstick: NEGATIVE
Ketones, ur: NEGATIVE
Nitrite: NEGATIVE
Protein, ur: NEGATIVE
Specific Gravity, Urine: 1.015
Urobilinogen, UA: 0.2
pH: 7.5

## 2011-01-22 LAB — BASIC METABOLIC PANEL
BUN: 11
CO2: 29
Calcium: 9.6
Chloride: 103
Creatinine, Ser: 0.78
GFR calc Af Amer: >60
GFR calc non Af Amer: >60
Glucose, Bld: 91
Potassium: 4.3
Sodium: 138

## 2011-01-22 LAB — ANAEROBIC CULTURE: Gram Stain: NONE SEEN

## 2011-01-22 LAB — CBC
HCT: 43.1
Hemoglobin: 14.8
MCHC: 34.5
MCV: 88.1
Platelets: 221
RBC: 4.89
RDW: 13.6
WBC: 8.3

## 2011-01-22 LAB — WOUND CULTURE: Gram Stain: NONE SEEN

## 2013-05-23 ENCOUNTER — Institutional Professional Consult (permissible substitution): Payer: Self-pay | Admitting: Pulmonary Disease

## 2013-05-26 ENCOUNTER — Institutional Professional Consult (permissible substitution): Payer: Self-pay | Admitting: Pulmonary Disease

## 2013-06-09 ENCOUNTER — Ambulatory Visit (INDEPENDENT_AMBULATORY_CARE_PROVIDER_SITE_OTHER): Payer: Managed Care, Other (non HMO) | Admitting: Pulmonary Disease

## 2013-06-09 ENCOUNTER — Encounter: Payer: Self-pay | Admitting: Pulmonary Disease

## 2013-06-09 VITALS — BP 100/64 | HR 88 | Temp 98.0°F | Ht 71.0 in | Wt 349.6 lb

## 2013-06-09 DIAGNOSIS — G4733 Obstructive sleep apnea (adult) (pediatric): Secondary | ICD-10-CM | POA: Insufficient documentation

## 2013-06-09 NOTE — Assessment & Plan Note (Signed)
The patient has a history of mild obstructive sleep apnea, and has responded well to CPAP in the past. He now feels that he is not resting as well, and has occasional breakthrough snoring. He is overdue for a new mask and supplies, and he raises the question whether his machine is working properly. Given its age, I think he is a good candidate for replacement. I would also like to optimize his pressure again on the automatic setting. Finally, I have stressed to him the importance of aggressive weight loss.

## 2013-06-09 NOTE — Patient Instructions (Signed)
Will see if we can get you a new machine.  If not, would still like to re-optimize your pressure with an auto device for a few weeks. Will send an order for new mask and supplies. Work on weight loss. If you are doing well, followup with me again in one year.

## 2013-06-09 NOTE — Progress Notes (Signed)
Subjective:    Patient ID: Andrew Mercer, male    DOB: 05-28-1950, 63 y.o.   MRN: [id number]  HPI The patient is a 63 year old male who I've been asked to see for management of obstructive sleep apnea. He was initially diagnosed in 2004 with an AHI of 9 events per hour, and an optimal CPAP pressure of 6 cm. I saw him in 2008, and asked him to followup in one year. I have not seen him since. The patient got a new CPAP device in 2008, but is unsure if it is working properly at this time. He also has an old mask and supplies, and has been told by his home care company that he does not qualify. The patient uses a nasal CPAP mask, and feels that he is able to keep his mouth closed during sleep. He feels that he is initially rested upon arising, but then quickly begins to have daytime sleepiness with inactivity. He feels that he is not sleeping as well as he has in the past. He will also get sleepy watching television in the evenings, but has no issues with driving. The patient's weight is up 10 pounds from his last visit in 2008, and his Epworth score today is 8.   Sleep Questionnaire What time do you typically go to bed?( Between what hours) 6-7pm 6-7pm at 1533 on 06/09/13 by Maisie Fus, CMA How long does it take you to fall asleep?  at 1533 on 06/09/13 by Maisie Fus, CMA How many times during the night do you wake up? 3 3 at 1533 on 06/09/13 by Maisie Fus, CMA What time do you get out of bed to start your day? 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 (In pounds) 0 oz (0 kg) 0 oz (0 kg) at 1533 on 06/09/13 by Maisie Fus, CMA Have you ever had a sleep study before? Yes Yes at 1533 on 06/09/13 by Maisie Fus, CMA If yes, location of study? [facility] at  1533 on 06/09/13 by Maisie Fus, CMA If yes, date of study? 2004 2004 at 1533 on 06/09/13 by Maisie Fus, CMA Do you currently use CPAP? Yes Yes at 1533 on 06/09/13 by Maisie Fus, CMA If so, what pressure? unsure unsure at 1533 on 06/09/13 by Maisie Fus, CMA Do you wear oxygen at any time? No No at 1533 on 06/09/13 by Maisie Fus, CMA   Review of Systems  Constitutional: Negative for fever and unexpected weight change.  HENT: Negative for congestion, dental problem, ear pain, nosebleeds, postnasal drip, rhinorrhea, sinus pressure, sneezing, sore throat and trouble swallowing.   Eyes: Negative for redness and itching.  Respiratory: Negative for cough, chest tightness, shortness of breath and wheezing.   Cardiovascular: Negative for palpitations and leg swelling.  Gastrointestinal: Negative for nausea and vomiting.  Genitourinary: Negative for dysuria.  Musculoskeletal: Positive for arthralgias and joint swelling.  Skin: Negative for rash ( itching).  Neurological: Negative for headaches.  Hematological: Does not bruise/bleed easily.  Psychiatric/Behavioral: Negative for dysphoric mood. The patient is not nervous/anxious.        Objective:   Physical Exam Constitutional:  Morbidly obese , no acute distress  HENT:  Nares patent without  discharge  Oropharynx without exudate, palate and uvula are very thick and elongated  Eyes:  Perrla, eomi, no scleral icterus  Neck:  No JVD, no TMG  Cardiovascular:  Normal rate, regular rhythm, no rubs or gallops.  No murmurs        Intact distal pulses  Pulmonary :  Normal breath sounds, no stridor or respiratory distress   No rales, rhonchi, or wheezing  Abdominal:  Soft, nondistended, bowel sounds present.  No tenderness noted.   Musculoskeletal:  mild lower extremity edema noted.  Lymph Nodes:  No cervical lymphadenopathy noted  Skin:  No cyanosis noted  Neurologic:  Alert, appropriate, moves all 4  extremities without obvious deficit.         Assessment & Plan:

## 2013-09-18 ENCOUNTER — Other Ambulatory Visit: Payer: Self-pay | Admitting: Orthopedic Surgery

## 2013-09-22 ENCOUNTER — Encounter (HOSPITAL_BASED_OUTPATIENT_CLINIC_OR_DEPARTMENT_OTHER): Payer: Self-pay | Admitting: *Deleted

## 2013-09-22 NOTE — Progress Notes (Signed)
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

## 2013-09-25 ENCOUNTER — Encounter (HOSPITAL_COMMUNITY)
Admission: RE | Admit: 2013-09-25 | Discharge: 2013-09-25 | Disposition: A | Discharge status: Home / Self Care | Payer: Managed Care, Other (non HMO) | Source: Ambulatory Visit | Attending: Orthopedic Surgery | Admitting: Orthopedic Surgery

## 2013-09-25 DIAGNOSIS — E119 Type 2 diabetes mellitus without complications: Secondary | ICD-10-CM | POA: Diagnosis not present

## 2013-09-25 DIAGNOSIS — G4730 Sleep apnea, unspecified: Secondary | ICD-10-CM | POA: Diagnosis not present

## 2013-09-25 DIAGNOSIS — Z79899 Other long term (current) drug therapy: Secondary | ICD-10-CM | POA: Diagnosis not present

## 2013-09-25 DIAGNOSIS — E785 Hyperlipidemia, unspecified: Secondary | ICD-10-CM | POA: Diagnosis not present

## 2013-09-25 DIAGNOSIS — G5600 Carpal tunnel syndrome, unspecified upper limb: Secondary | ICD-10-CM | POA: Diagnosis not present

## 2013-09-25 DIAGNOSIS — I10 Essential (primary) hypertension: Secondary | ICD-10-CM | POA: Diagnosis not present

## 2013-09-25 DIAGNOSIS — G5620 Lesion of ulnar nerve, unspecified upper limb: Secondary | ICD-10-CM | POA: Diagnosis not present

## 2013-09-25 DIAGNOSIS — F17200 Nicotine dependence, unspecified, uncomplicated: Secondary | ICD-10-CM | POA: Diagnosis not present

## 2013-09-27 ENCOUNTER — Ambulatory Visit (HOSPITAL_BASED_OUTPATIENT_CLINIC_OR_DEPARTMENT_OTHER)
Admission: RE | Admit: 2013-09-27 | Discharge: 2013-09-27 | Disposition: A | Discharge status: Home / Self Care | Payer: Managed Care, Other (non HMO) | Source: Ambulatory Visit | Attending: Orthopedic Surgery | Admitting: Orthopedic Surgery

## 2013-09-27 ENCOUNTER — Ambulatory Visit (HOSPITAL_BASED_OUTPATIENT_CLINIC_OR_DEPARTMENT_OTHER): Payer: Managed Care, Other (non HMO) | Admitting: Anesthesiology

## 2013-09-27 ENCOUNTER — Encounter (HOSPITAL_BASED_OUTPATIENT_CLINIC_OR_DEPARTMENT_OTHER): Payer: Self-pay | Admitting: Orthopedic Surgery

## 2013-09-27 ENCOUNTER — Encounter (HOSPITAL_BASED_OUTPATIENT_CLINIC_OR_DEPARTMENT_OTHER): Payer: Managed Care, Other (non HMO) | Admitting: Anesthesiology

## 2013-09-27 ENCOUNTER — Encounter (HOSPITAL_BASED_OUTPATIENT_CLINIC_OR_DEPARTMENT_OTHER)
Admission: RE | Disposition: A | Discharge status: Home / Self Care | Payer: Self-pay | Source: Ambulatory Visit | Attending: Orthopedic Surgery

## 2013-09-27 DIAGNOSIS — E119 Type 2 diabetes mellitus without complications: Secondary | ICD-10-CM | POA: Insufficient documentation

## 2013-09-27 DIAGNOSIS — E785 Hyperlipidemia, unspecified: Secondary | ICD-10-CM | POA: Insufficient documentation

## 2013-09-27 DIAGNOSIS — G5620 Lesion of ulnar nerve, unspecified upper limb: Secondary | ICD-10-CM | POA: Diagnosis not present

## 2013-09-27 DIAGNOSIS — G4730 Sleep apnea, unspecified: Secondary | ICD-10-CM | POA: Insufficient documentation

## 2013-09-27 DIAGNOSIS — G5600 Carpal tunnel syndrome, unspecified upper limb: Secondary | ICD-10-CM | POA: Insufficient documentation

## 2013-09-27 DIAGNOSIS — Z79899 Other long term (current) drug therapy: Secondary | ICD-10-CM | POA: Insufficient documentation

## 2013-09-27 DIAGNOSIS — F17200 Nicotine dependence, unspecified, uncomplicated: Secondary | ICD-10-CM | POA: Insufficient documentation

## 2013-09-27 DIAGNOSIS — I10 Essential (primary) hypertension: Secondary | ICD-10-CM | POA: Insufficient documentation

## 2013-09-27 HISTORY — DX: Presence of spectacles and contact lenses: Z97.3

## 2013-09-27 HISTORY — PX: CARPAL TUNNEL RELEASE: SHX101

## 2013-09-27 HISTORY — DX: Gastro-esophageal reflux disease without esophagitis: K21.9

## 2013-09-27 HISTORY — DX: Presence of dental prosthetic device (complete) (partial): Z97.2

## 2013-09-27 HISTORY — PX: ULNAR NERVE TRANSPOSITION: SHX2595

## 2013-09-27 LAB — POCT I-STAT, CHEM 8
BUN: 16 mg/dL (ref 6–23)
Calcium, Ion: 1.25 mmol/L (ref 1.13–1.30)
Chloride: 105 mEq/L (ref 96–112)
Creatinine, Ser: 0.8 mg/dL (ref 0.50–1.35)
Glucose, Bld: 126 mg/dL — ABNORMAL HIGH (ref 70–99)
HCT: 45 % (ref 39.0–52.0)
Hemoglobin: 15.3 g/dL (ref 13.0–17.0)
Potassium: 4.3 mEq/L (ref 3.7–5.3)
Sodium: 141 mEq/L (ref 137–147)
TCO2: 21 mmol/L (ref 0–100)

## 2013-09-27 LAB — GLUCOSE, CAPILLARY: Glucose-Capillary: 138 mg/dL — ABNORMAL HIGH (ref 70–99)

## 2013-09-27 SURGERY — CARPAL TUNNEL RELEASE
Anesthesia: General | Laterality: Left

## 2013-09-27 MED ORDER — BUPIVACAINE-EPINEPHRINE (PF) 0.5% -1:200000 IJ SOLN
INTRAMUSCULAR | Status: DC | PRN
  Administered 2013-09-27: 28 mL via PERINEURAL

## 2013-09-27 MED ORDER — OXYCODONE HCL 5 MG PO TABS
5.0000 mg | ORAL_TABLET | Freq: Once | ORAL | Status: AC
  Administered 2013-09-27: 5 mg via ORAL

## 2013-09-27 MED ORDER — FENTANYL CITRATE 0.05 MG/ML IJ SOLN
50.0000 ug | INTRAMUSCULAR | Status: DC | PRN
  Administered 2013-09-27: 100 ug via INTRAVENOUS

## 2013-09-27 MED ORDER — MIDAZOLAM HCL 2 MG/2ML IJ SOLN
INTRAMUSCULAR | Status: AC
  Filled 2013-09-27: qty 2

## 2013-09-27 MED ORDER — OXYCODONE-ACETAMINOPHEN 10-325 MG PO TABS: 1.0000 | ORAL_TABLET | ORAL | Status: DC | PRN

## 2013-09-27 MED ORDER — CEFAZOLIN SODIUM-DEXTROSE 2-3 GM-% IV SOLR
INTRAVENOUS | Status: AC
  Filled 2013-09-27: qty 100

## 2013-09-27 MED ORDER — DEXTROSE 5 % IV SOLN: 3.0000 g | INTRAVENOUS | Status: DC

## 2013-09-27 MED ORDER — FENTANYL CITRATE 0.05 MG/ML IJ SOLN
INTRAMUSCULAR | Status: DC | PRN
  Administered 2013-09-27: 50 ug via INTRAVENOUS

## 2013-09-27 MED ORDER — FENTANYL CITRATE 0.05 MG/ML IJ SOLN
INTRAMUSCULAR | Status: AC
  Filled 2013-09-27: qty 4

## 2013-09-27 MED ORDER — CHLORHEXIDINE GLUCONATE 4 % EX LIQD: 60.0000 mL | Freq: Once | CUTANEOUS | Status: DC

## 2013-09-27 MED ORDER — FENTANYL CITRATE 0.05 MG/ML IJ SOLN
INTRAMUSCULAR | Status: AC
  Filled 2013-09-27: qty 2

## 2013-09-27 MED ORDER — FENTANYL CITRATE 0.05 MG/ML IJ SOLN: 25.0000 ug | INTRAMUSCULAR | Status: DC | PRN

## 2013-09-27 MED ORDER — BUPIVACAINE HCL (PF) 0.25 % IJ SOLN
INTRAMUSCULAR | Status: AC
  Filled 2013-09-27: qty 30

## 2013-09-27 MED ORDER — ONDANSETRON HCL 4 MG/2ML IJ SOLN: 4.0000 mg | Freq: Once | INTRAMUSCULAR | Status: DC | PRN

## 2013-09-27 MED ORDER — OXYCODONE HCL 5 MG PO TABS
ORAL_TABLET | ORAL | Status: AC
  Filled 2013-09-27: qty 1

## 2013-09-27 MED ORDER — CEFAZOLIN SODIUM 1-5 GM-% IV SOLN
INTRAVENOUS | Status: AC
  Filled 2013-09-27: qty 50

## 2013-09-27 MED ORDER — LIDOCAINE HCL (CARDIAC) 20 MG/ML IV SOLN
INTRAVENOUS | Status: DC | PRN
  Administered 2013-09-27: 100 mg via INTRAVENOUS

## 2013-09-27 MED ORDER — LACTATED RINGERS IV SOLN
INTRAVENOUS | Status: DC
  Administered 2013-09-27 (×2): via INTRAVENOUS

## 2013-09-27 MED ORDER — PROPOFOL 10 MG/ML IV BOLUS
INTRAVENOUS | Status: DC | PRN
  Administered 2013-09-27: 300 mg via INTRAVENOUS

## 2013-09-27 MED ORDER — BUPIVACAINE HCL (PF) 0.25 % IJ SOLN
INTRAMUSCULAR | Status: DC | PRN
  Administered 2013-09-27: 9 mL

## 2013-09-27 MED ORDER — MIDAZOLAM HCL 2 MG/2ML IJ SOLN
1.0000 mg | INTRAMUSCULAR | Status: DC | PRN
  Administered 2013-09-27: 2 mg via INTRAVENOUS

## 2013-09-27 MED ORDER — ONDANSETRON HCL 4 MG/2ML IJ SOLN
INTRAMUSCULAR | Status: DC | PRN
  Administered 2013-09-27: 4 mg via INTRAVENOUS

## 2013-09-27 MED ORDER — DEXAMETHASONE SODIUM PHOSPHATE 10 MG/ML IJ SOLN
INTRAMUSCULAR | Status: DC | PRN
  Administered 2013-09-27: 10 mg via INTRAVENOUS

## 2013-09-27 SURGICAL SUPPLY — 54 items
BLADE MINI RND TIP GREEN BEAV (BLADE) IMPLANT
BLADE SURG 15 STRL LF DISP TIS (BLADE) ×1 IMPLANT
BLADE SURG 15 STRL SS (BLADE) ×3
BNDG CMPR 9X4 STRL LF SNTH (GAUZE/BANDAGES/DRESSINGS) ×1
BNDG COHESIVE 3X5 TAN STRL LF (GAUZE/BANDAGES/DRESSINGS) ×5 IMPLANT
BNDG ESMARK 4X9 LF (GAUZE/BANDAGES/DRESSINGS) ×3 IMPLANT
BNDG GAUZE ELAST 4 BULKY (GAUZE/BANDAGES/DRESSINGS) ×3 IMPLANT
CHLORAPREP W/TINT 26ML (MISCELLANEOUS) ×5 IMPLANT
CORDS BIPOLAR (ELECTRODE) ×5 IMPLANT
COVER MAYO STAND STRL (DRAPES) ×3 IMPLANT
COVER TABLE BACK 60X90 (DRAPES) ×3 IMPLANT
CUFF TOURNIQUET SINGLE 18IN (TOURNIQUET CUFF) ×1 IMPLANT
CUFF TOURNIQUET SINGLE 24IN (TOURNIQUET CUFF) ×2 IMPLANT
DECANTER SPIKE VIAL GLASS SM (MISCELLANEOUS) IMPLANT
DRAPE EXTREMITY T 121X128X90 (DRAPE) ×3 IMPLANT
DRAPE SURG 17X23 STRL (DRAPES) ×5 IMPLANT
DRSG PAD ABDOMINAL 8X10 ST (GAUZE/BANDAGES/DRESSINGS) ×3 IMPLANT
GAUZE SPONGE 4X4 12PLY STRL (GAUZE/BANDAGES/DRESSINGS) ×3 IMPLANT
GAUZE SPONGE 4X4 16PLY XRAY LF (GAUZE/BANDAGES/DRESSINGS) IMPLANT
GAUZE XEROFORM 1X8 LF (GAUZE/BANDAGES/DRESSINGS) ×3 IMPLANT
GLOVE BIO SURGEON STRL SZ7.5 (GLOVE) ×2 IMPLANT
GLOVE BIOGEL PI IND STRL 7.0 (GLOVE) IMPLANT
GLOVE BIOGEL PI IND STRL 8 (GLOVE) IMPLANT
GLOVE BIOGEL PI IND STRL 8.5 (GLOVE) ×1 IMPLANT
GLOVE BIOGEL PI INDICATOR 7.0 (GLOVE) ×2
GLOVE BIOGEL PI INDICATOR 8 (GLOVE) ×2
GLOVE BIOGEL PI INDICATOR 8.5 (GLOVE) ×2
GLOVE ECLIPSE 7.0 STRL STRAW (GLOVE) ×2 IMPLANT
GLOVE SURG ORTHO 8.0 STRL STRW (GLOVE) ×3 IMPLANT
GOWN STRL REUS W/ TWL LRG LVL3 (GOWN DISPOSABLE) ×1 IMPLANT
GOWN STRL REUS W/TWL LRG LVL3 (GOWN DISPOSABLE) ×3
GOWN STRL REUS W/TWL XL LVL3 (GOWN DISPOSABLE) ×5 IMPLANT
LOOP VESSEL MAXI BLUE (MISCELLANEOUS) IMPLANT
NEEDLE 27GAX1X1/2 (NEEDLE) ×2 IMPLANT
NS IRRIG 1000ML POUR BTL (IV SOLUTION) ×3 IMPLANT
PACK BASIN DAY SURGERY FS (CUSTOM PROCEDURE TRAY) ×3 IMPLANT
PAD CAST 3X4 CTTN HI CHSV (CAST SUPPLIES) ×1 IMPLANT
PAD CAST 4YDX4 CTTN HI CHSV (CAST SUPPLIES) ×1 IMPLANT
PADDING CAST ABS 4INX4YD NS (CAST SUPPLIES)
PADDING CAST ABS COTTON 4X4 ST (CAST SUPPLIES) ×1 IMPLANT
PADDING CAST COTTON 3X4 STRL (CAST SUPPLIES)
PADDING CAST COTTON 4X4 STRL (CAST SUPPLIES)
SLEEVE SCD COMPRESS KNEE MED (MISCELLANEOUS) ×3 IMPLANT
SPLINT PLASTER CAST XFAST 3X15 (CAST SUPPLIES) IMPLANT
SPLINT PLASTER XTRA FASTSET 3X (CAST SUPPLIES)
STOCKINETTE 4X48 STRL (DRAPES) ×3 IMPLANT
SUT VIC AB 2-0 SH 27 (SUTURE) ×3
SUT VIC AB 2-0 SH 27XBRD (SUTURE) ×1 IMPLANT
SUT VICRYL 4-0 PS2 18IN ABS (SUTURE) ×1 IMPLANT
SUT VICRYL RAPIDE 4/0 PS 2 (SUTURE) ×3 IMPLANT
SYR BULB 3OZ (MISCELLANEOUS) ×3 IMPLANT
SYR CONTROL 10ML LL (SYRINGE) ×3 IMPLANT
TOWEL OR 17X24 6PK STRL BLUE (TOWEL DISPOSABLE) ×3 IMPLANT
UNDERPAD 30X30 INCONTINENT (UNDERPADS AND DIAPERS) ×3 IMPLANT

## 2013-09-27 NOTE — Brief Op Note (Signed)
09/27/2013  11:05 AM  PATIENT:  Andrew Mercer  63 y.o. male  PRE-OPERATIVE DIAGNOSIS:  LEFT CARPAL TUNNEL SYNDROME , LEFT CUBITAL TUNNEL  POST-OPERATIVE DIAGNOSIS:  LEFT CARPAL TUNNEL SYNDROME , LEFT CUBITAL TUNNEL  PROCEDURE:  Procedure(s): LEFT WRIST CARPAL TUNNEL RELEASE (Left) RELEASE POSSIBLE TRANSPOSITION ULNAR NERVE LEFT ELBOW  (Left)  SURGEON:  Surgeon(s) and Role:    * Nicki ReaperGary R Aysa Larivee, MD - Primary  PHYSICIAN ASSISTANT:   ASSISTANTS: none   ANESTHESIA:   local, regional and general  EBL:  Total I/O In: 1000 [I.V.:1000] Out: -   BLOOD ADMINISTERED:none  DRAINS: none   LOCAL MEDICATIONS USED:  BUPIVICAINE   SPECIMEN:  No Specimen  DISPOSITION OF SPECIMEN:  N/A  COUNTS:  YES  TOURNIQUET:   Total Tourniquet Time Documented: Upper Arm (Left) - 38 minutes Total: Upper Arm (Left) - 38 minutes   DICTATION: DICTATION NUMBER: [id number]: [id number]  PLAN OF CARE: Discharge to home after PACU  PATIENT DISPOSITION:  PACU - hemodynamically stable.

## 2013-09-27 NOTE — H&P (Signed)
Andrew HarriesJoseph Mercer is a 63 year old right hand dominant male complaining of numbness and tingling in his left hand ring and little finger. He states this has been going on for a week. It is constant in nature. He has no history of injury to the hand or neck. He is a Naval architecttruck driver. He has had no MVA. He is taking 2 Advil twice a day without significant relief. It does awaken him at night. He has a history of diabetes, arthritis and gout. There is a family history of thyroid problems in addition to rheumatoid arthritis. He is complaining of his left side only. This is constant, moderate to severe in nature with a feeling of numbness and weakness. Activity, exercise and work makes it worse, elevation helps. He has had his nerve conductions done revealing changes of both median and ulnar nerve at his left side.  The right side is entirely normal.    PAST MEDICAL HISTORY: He has an allergy to statins and Clindamycin. He is on the following medications: Amlodipine, Cozaar, Actos, Protonix, Xyzal, Metformin, Lovaza, Niaspan, and Tradjenta. He has had bilateral carpal tunnel releases done, one by myself and one by Dr. [facility] [place] and sinus surgery by Dr. Brion Alimentrossly.   FAMILY H ISTORY: Positive for diabetes, high BP and arthritis.  SOCIAL HISTORY: He smokes 1  PPD and is advised to quit and the reasons behind this. He does not drink. He is married.  REVIEW OF SYSTEMS: Positive for glasses, high BP, sleep disorder, otherwise negative for 14 points. Andrew SavoyJoseph R Mercer is an 63 y.o. male.   Chief Complaint: CTS and cubital tunnel left HPI: see above  Past Medical History  Diagnosis Date  . HTN (hypertension)   . Diabetes     TYPE II  . Hyperlipidemia   . Seasonal allergies   . Wears dentures     top  . Wears glasses     reading  . GERD (gastroesophageal reflux disease)   . Sleep apnea     uses a cpap    Past Surgical History  Procedure Laterality Date  . Nasal sinus surgery  1998    x2   . Parotid gland tumor excision Bilateral 2012  . Carpal tunnel release Bilateral   . Meniscus repair Left early 2000s  . Tonsillectomy    . Ercp    . Colonoscopy    . Hemorrhoid surgery  2011    Family History  Problem Relation Age of Onset  . Emphysema Mother   . Allergies Mother   . Asthma Mother   . Rheum arthritis Mother   . Rheum arthritis Brother   . Rheum arthritis Sister   . Hyperlipidemia Sister   . Hypertension Father    Social History:  reports that he has been smoking Cigarettes.  He started smoking about 49 years ago. He has a 73.5 pack-year smoking history. He does not have any smokeless tobacco history on file. He reports that he does not drink alcohol or use illicit drugs.  Allergies:  Allergies  Allergen Reactions  . Statins     Leg cramps/liver problems  . Clindamycin/Lincomycin     Adverse effects    No prescriptions prior to admission    No results found for this or any previous visit (from the past 48 hour(s)).  No results found.   Pertinent items are noted in HPI.  Height 5\' 11"  (1.803 m), weight 158.305 kg (349 lb).  General appearance: alert, cooperative and appears  stated age Head: Normocephalic, without obvious abnormality Neck: no JVD Resp: clear to auscultation bilaterally Cardio: regular rate and rhythm, S1, S2 normal, no murmur, click, rub or gallop GI: soft, non-tender; bowel sounds normal; no masses,  no organomegaly Extremities: extremities normal, atraumatic, no cyanosis or edema Pulses: 2+ and symmetric Skin: Skin color, texture, turgor normal. No rashes or lesions Neurologic: Grossly normal Incision/Wound: na  Assessment/Plan X-rays of his cervical spine reveals degenerative changes at C5/6. C6/7 is not easily visible but appears to have some degenerative changes.  X-rays of his elbow and hand are negative.  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  We have discussed  the possibility of release of the carpal canal along with decompression, possible transposition ulnar nerve.  The pre, peri and postoperative course were discussed along with the risks and complications.  The patient is aware there is no guarantee with the surgery, possibility of infection, recurrence, injury to arteries, nerves, tendons, incomplete relief of symptoms and dystrophy.  This will be scheduled as an outpatient under regional anesthesia, left arm, left wrist.    Andrew Mercer R 09/27/2013, 5:44 AM

## 2013-09-27 NOTE — Anesthesia Postprocedure Evaluation (Signed)
  Anesthesia Post-op Note  Patient: Andrew CurlsJoseph R Mercer  Procedure(s) Performed: Procedure(s): LEFT WRIST CARPAL TUNNEL RELEASE (Left) RELEASE POSSIBLE TRANSPOSITION ULNAR NERVE LEFT ELBOW  (Left)  Patient Location: PACU  Anesthesia Type:GA combined with regional for post-op pain  Level of Consciousness: awake, alert  and oriented  Airway and Oxygen Therapy: Patient Spontanous Breathing  Post-op Pain: mild  Post-op Assessment: Post-op Vital signs reviewed  Post-op Vital Signs: Reviewed  Last Vitals:  Filed Vitals:   09/27/13 1229  BP: 126/70  Pulse: 80  Temp: 37 C  Resp:     Complications: No apparent anesthesia complications

## 2013-09-27 NOTE — Op Note (Signed)
[name]:  [facility], [facility]               ACCOUNT NO.:  [id number]  MEDICAL RECORD NO.:  [id number]  LOCATION:                                 FACILITY:  PHYSICIAN:  Andrew Mercer, M.D.       DATE OF BIRTH:  [birth year]  DATE OF PROCEDURE:  09/27/2013 DATE OF DISCHARGE:                              OPERATIVE REPORT   PREOPERATIVE DIAGNOSIS:  Cubital tunnel, carpal tunnel syndrome, left arm.  POSTOPERATIVE DIAGNOSIS:  Cubital tunnel, carpal tunnel syndrome, left arm.  OPERATION: 1. Decompression median nerve, left wrist. 2. Decompression ulnar nerve, left elbow.  SURGEON:  Andrew SaltGary Dawnmarie Breon, MD  ANESTHESIA:  Supraclavicular block general with local infiltration.  ANESTHESIOLOGIST:  Andrew Mercer, M.D.  HISTORY:  The patient is a 63 year old male with a history of carpal tunnel syndrome, cubital tunnel syndrome.  Nerve conductions are positive for each.  He has elected to undergo surgical decompression of each, possible transposition to the nerve.  He is aware that there is no guarantee with the surgery; possibility of infection; recurrence of injury to arteries, nerves, tendons; incomplete relief of symptoms; dystrophy.  He is aware that the cubital tunnel syndrome.  We are attempting to halt the degeneration of the nerve, hopefully this will get better but there is no guarantee of that, but we are attempting to halt the process where it is and hopefully allow improvement.  In the preoperative area, the patient is seen, the extremity marked by both patient and surgeon.  Antibiotic given.  PROCEDURE IN DETAIL:  The patient was brought to the operating room, where a supraclavicular block was carried out without difficulty.  He was prepped using ChloraPrep, supine position.  He had some mobility to his arms where general anesthetic was given.  This was all done under the direction of Dr. Ivin Mercer.  Time-out taken confirming the patient and procedure after prepping with ChloraPrep and allowing a  3-minute dry time.  The limb was exsanguinated with an Esmarch bandage.  Tourniquet placed high on the arm was inflated to 150 mmHg.  A longitudinal incision was made in the left palm, carried down through subcutaneous tissue.  Bleeders were electrocauterized.  Palmar fascia was split. Superficial palmar arch identified.  Flexor tendon to the ring little finger identified to the ulnar side of the median nerve.  Carpal retinaculum was incised with sharp dissection.  Right angle and Sewall retractor were placed between skin and forearm fascia.  Fascia was released for approximately a centimeter and a half to two under direct vision.  Significant scarring was present throughout the nerve.  This was released.  An epineurolysis was not formally performed.  Motor branch entered into muscle.  No further lesions were identified.  Wound was irrigated.  Skin closed with interrupted 4-0 Vicryl Rapide sutures. A separate incision was then made over the medial epicondyle of the left elbow, carried down through subcutaneous tissue.  Bleeders again electrocauterized with bipolar.  The dissection carried down to Osborne's fascia.  The fascia was released in its most posterior aspect. Two knee retractors were placed between the subcutaneous tissue and fascia of the flexor carpi ulnaris.  The flexor carpi ulnaris muscle was then  split longitudinally with sharp dissection in the superficial fascia, the muscle was then split with blunt dissection.  A KMI carpal tunnel guide was then inserted between the ulnar nerve and the deep fascia was then transected using angled ENT straight scissors.  Nerve was protected throughout the procedure, was found to be intact following this, the dissection was then carried proximally, again the superficial fascia was separated from the subcutaneous tissue.  The 2 knee retractors were placed proximally.  The [facility] guide was then placed between the ulnar nerve and the deep  fascia, and the fascia was released to the level of the arcade of Struthers.  The nerve was noted to be intact, with full flexion of the elbow, it did not subluxate.  The wound was copiously irrigated with saline.  Osborne's fascia was then sutured to the posterior skin flap.  Subcutaneous tissue with figure-of-eight 2- 0 Vicryl sutures.  Subcutaneous tissue was closed with interrupted 2-0 Vicryl and the skin with interrupted 4-0 Vicryl Rapide sutures.  Local infiltration to each wound was given approximately 5 mL at each site.  A sterile compressive long-arm dressing was applied and deflation of the tourniquet, all fingers immediately pinked.  He was taken to the recovery room for observation in satisfactory condition.  He will be discharged home [facility], on Percocet.          ______________________________ Andrew Mercer, M.D.     GK/MEDQ  D:  09/27/2013  T:  09/27/2013  Job:  [id number]

## 2013-09-27 NOTE — Progress Notes (Signed)
Assisted Dr. Crews with left, ultrasound guided, interscalene  block. Side rails up, monitors on throughout procedure. See vital signs in flow sheet. Tolerated Procedure well. 

## 2013-09-27 NOTE — Anesthesia Preprocedure Evaluation (Signed)
Anesthesia Evaluation  Patient identified by MRN, date of birth, ID band Patient awake    Reviewed: Allergy & Precautions, H&P , NPO status , Patient's Chart, lab work & pertinent test results  Airway Mallampati: II TM Distance: >3 FB Neck ROM: Full    Dental  (+) Teeth Intact, Dental Advisory Given   Pulmonary sleep apnea and Continuous Positive Airway Pressure Ventilation , Current Smoker,  breath sounds clear to auscultation        Cardiovascular Exercise Tolerance: Poor hypertension, Pt. on medications Rhythm:Regular Rate:Normal     Neuro/Psych    GI/Hepatic   Endo/Other  diabetes, Well Controlled, Type 2, Oral Hypoglycemic AgentsMorbid obesity  Renal/GU      Musculoskeletal   Abdominal   Peds  Hematology   Anesthesia Other Findings   Reproductive/Obstetrics                           Anesthesia Physical Anesthesia Plan  ASA: III  Anesthesia Plan: General   Post-op Pain Management:    Induction: Intravenous  Airway Management Planned: LMA  Additional Equipment:   Intra-op Plan:   Post-operative Plan: Extubation in OR  Informed Consent: I have reviewed the patients History and Physical, chart, labs and discussed the procedure including the risks, benefits and alternatives for the proposed anesthesia with the patient or authorized representative who has indicated his/her understanding and acceptance.   Dental advisory given  Plan Discussed with: Anesthesiologist, CRNA and Surgeon  Anesthesia Plan Comments:         Anesthesia Quick Evaluation

## 2013-09-27 NOTE — Anesthesia Procedure Notes (Addendum)
Anesthesia Regional Block:  Supraclavicular block  Pre-Anesthetic Checklist: ,, timeout performed, Correct Patient, Correct Site, Correct Laterality, Correct Procedure, Correct Position, site marked, Risks and benefits discussed,  Surgical consent,  Pre-op evaluation,  At surgeon's request and post-op pain management  Laterality: Left and Upper  Prep: chloraprep       Needles:  Injection technique: Single-shot  Needle Type: Echogenic Stimulator Needle     Needle Length: 5cm 5 cm Needle Gauge: 21 and 21 G    Additional Needles:  Procedures: ultrasound guided (picture in chart) Supraclavicular block Narrative:  Start time: 09/27/2013 9:16 AM End time: 09/27/2013 9:23 AM Injection made incrementally with aspirations every 5 mL.  Performed by: Personally  Anesthesiologist: Sheldon Silvanavid Crews   Procedure Name: LMA Insertion Date/Time: 09/27/2013 10:03 AM Performed by: Caren MacadamARTER, Sofi Bryars W Pre-anesthesia Checklist: Patient identified, Emergency Drugs available, Suction available and Patient being monitored Patient Re-evaluated:Patient Re-evaluated prior to inductionOxygen Delivery Method: Circle System Utilized Preoxygenation: Pre-oxygenation with 100% oxygen Intubation Type: IV induction Ventilation: Mask ventilation without difficulty LMA: LMA inserted LMA Size: 5.0 Number of attempts: 1 Airway Equipment and Method: bite block Placement Confirmation: positive ETCO2 and breath sounds checked- equal and bilateral Tube secured with: Tape Dental Injury: Teeth and Oropharynx as per pre-operative assessment

## 2013-09-27 NOTE — Discharge Instructions (Addendum)

## 2013-09-27 NOTE — Op Note (Signed)
DICTATION NUMBER: [id number]

## 2013-09-27 NOTE — Transfer of Care (Signed)
Immediate Anesthesia Transfer of Care Note  Patient: Andrew Mercer  Procedure(s) Performed: Procedure(s): LEFT WRIST CARPAL TUNNEL RELEASE (Left) RELEASE POSSIBLE TRANSPOSITION ULNAR NERVE LEFT ELBOW  (Left)  Patient Location: PACU  Anesthesia Type:General  Level of Consciousness: awake  Airway & Oxygen Therapy: Patient Spontanous Breathing and Patient connected to face mask oxygen  Post-op Assessment: Report given to PACU RN and Post -op Vital signs reviewed and stable  Post vital signs: Reviewed and stable  Complications: No apparent anesthesia complications

## 2013-09-28 ENCOUNTER — Encounter (HOSPITAL_BASED_OUTPATIENT_CLINIC_OR_DEPARTMENT_OTHER): Payer: Self-pay | Admitting: Orthopedic Surgery

## 2014-06-11 ENCOUNTER — Encounter: Payer: Self-pay | Admitting: Pulmonary Disease

## 2014-06-11 ENCOUNTER — Ambulatory Visit (INDEPENDENT_AMBULATORY_CARE_PROVIDER_SITE_OTHER): Payer: Medicare Other | Admitting: Pulmonary Disease

## 2014-06-11 VITALS — BP 138/74 | HR 78 | Temp 97.0°F | Ht 71.0 in | Wt 351.0 lb

## 2014-06-11 DIAGNOSIS — G4733 Obstructive sleep apnea (adult) (pediatric): Secondary | ICD-10-CM

## 2014-06-11 NOTE — Progress Notes (Signed)
   Subjective:    Patient ID: Andrew Mercer, male    DOB: 28-Jul-1950, 64 y.o.   MRN: [id number]  HPI The patient comes in today for follow-up of his obstructive sleep apnea. He is wearing C Pap compliantly by his download, with excellent control of his AHI. He is having some mask leak issues, and would like to look at a different type of mask. He feels that he is sleeping well with his device, with excellent daytime alertness. Of note, his weight is unchanged from the last visit.   Review of Systems  Constitutional: Negative for fever and unexpected weight change.  HENT: Negative for congestion, dental problem, ear pain, nosebleeds, postnasal drip, rhinorrhea, sinus pressure, sneezing, sore throat and trouble swallowing.   Eyes: Negative for redness and itching.  Respiratory: Negative for cough, chest tightness, shortness of breath and wheezing.   Cardiovascular: Negative for palpitations and leg swelling.  Gastrointestinal: Negative for nausea and vomiting.  Genitourinary: Negative for dysuria.  Musculoskeletal: Negative for joint swelling.  Skin: Negative for rash.  Neurological: Negative for headaches.  Hematological: Does not bruise/bleed easily.  Psychiatric/Behavioral: Negative for dysphoric mood. The patient is not nervous/anxious.        Objective:   Physical Exam Morbidly obese male in no acute distress Nose without purulence or discharge noted Neck without lymphadenopathy or thyromegaly No skin breakdown or pressure necrosis from the C Pap mask Lower extremities with 2+ edema, no cyanosis Alert and oriented, moves all 4 extremities.       Assessment & Plan:

## 2014-06-11 NOTE — Assessment & Plan Note (Signed)
The patient is wearing C Pap compliantly by his download, and has excellent control of his AHI. He is having some mask leak issues, and he will work on a better fit with his home care company. Finally, I have encouraged him to work aggressively on weight loss.

## 2014-06-11 NOTE — Patient Instructions (Signed)
Continue on cpap, and keep up with mask changes and supplies. Will send an order to change your home care company Work on weight loss followup with me again in one year.

## 2014-09-26 ENCOUNTER — Telehealth: Payer: Self-pay | Admitting: Pulmonary Disease

## 2014-09-26 NOTE — Telephone Encounter (Signed)
In review of Dr. Teddy Spike notes, there is no mention of him needing supplemental oxygen.  He would need to come in for ROV to assess whether he needs home oxygen.  Could then assess whether he would qualify for a POC.  If he has home oxygen set up through another provider already, then he should d/w provider who set him up with oxygen initially.

## 2014-09-26 NOTE — Telephone Encounter (Signed)
lmomtcb x1 for spouse 

## 2014-09-26 NOTE — Telephone Encounter (Signed)
Spoke with pt's wife, wants to see if he qualifies for a POC.  Their current dme provider [facility]) does not offer POC's.  Pt is wanting to switch to a new DME (prefers to not be with [employer] with possible).  Pt is also treated for OSA and was not placed with with a provider, so this message will be sent to Dr. Craige Cotta.    Dr. Craige Cotta, please advise if you are ok with ordering a POC eval for this patient.  Thanks!

## 2014-09-27 NOTE — Telephone Encounter (Signed)
lmtcb X2 for spouse

## 2014-09-28 NOTE — Telephone Encounter (Signed)
Left detailed message advising wife to call office and schedule appointment to come in and see Dr. Craige Cotta to assess whether patient needs home oxygen.   Closing encounter, will await wife to call and schedule appointment.

## 2014-10-03 ENCOUNTER — Telehealth: Payer: Self-pay | Admitting: Pulmonary Disease

## 2014-10-03 NOTE — Telephone Encounter (Signed)
Spoke with pt, according to last note the pt and wife were asking for a POC-after further explanation of what is needed, the pt and wife are asking for a portable cpap, not portable concentrator.  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  This is why they are requesting a new order for a portable cpap. Pt's current DME company does not offer portable cpap machines, which is why they want to switch to another company (not [employer]).  Dr. Craige Cotta are you ok with this order and dme change?  Thanks!

## 2014-10-05 NOTE — Telephone Encounter (Signed)
Can he check with his DME first to determine if they can provide him with a power adaptor for his CPAP machine so he can plug it into his truck.  If this is not available, then okay to send order to change DME.

## 2014-10-05 NOTE — Telephone Encounter (Signed)
Patient's wife notified.   She will check with DME and will call back to let us know if we need to do anything. Nothing further needed.

## 2015-01-09 LAB — HEMOGLOBIN A1C: Hgb A1c MFr Bld: 10.6 % — AB (ref 4.0–6.0)

## 2015-03-12 ENCOUNTER — Encounter: Payer: Self-pay | Admitting: "Endocrinology

## 2015-03-12 ENCOUNTER — Ambulatory Visit (INDEPENDENT_AMBULATORY_CARE_PROVIDER_SITE_OTHER): Payer: Medicare Other | Admitting: "Endocrinology

## 2015-03-12 VITALS — BP 134/84 | HR 95 | Ht 71.0 in | Wt 328.0 lb

## 2015-03-12 DIAGNOSIS — E785 Hyperlipidemia, unspecified: Secondary | ICD-10-CM

## 2015-03-12 DIAGNOSIS — E1165 Type 2 diabetes mellitus with hyperglycemia: Secondary | ICD-10-CM

## 2015-03-12 DIAGNOSIS — IMO0002 Reserved for concepts with insufficient information to code with codable children: Secondary | ICD-10-CM

## 2015-03-12 DIAGNOSIS — I10 Essential (primary) hypertension: Secondary | ICD-10-CM

## 2015-03-12 DIAGNOSIS — E118 Type 2 diabetes mellitus with unspecified complications: Secondary | ICD-10-CM | POA: Diagnosis not present

## 2015-03-12 NOTE — Progress Notes (Signed)
Subjective:    Patient ID: Andrew Mercer, male    DOB: Aug 14, 1950. Patient is being seen in consultation for management of diabetes requested by  Pamelia Hoit, MD  Past Medical History  Diagnosis Date  . HTN (hypertension)   . Diabetes (HCC)     TYPE II  . Hyperlipidemia   . Seasonal allergies   . Wears dentures     top  . Wears glasses     reading  . GERD (gastroesophageal reflux disease)   . Sleep apnea     uses a cpap   Past Surgical History  Procedure Laterality Date  . Nasal sinus surgery  1998    x2  . Parotid gland tumor excision Bilateral 2012  . Carpal tunnel release Bilateral   . Meniscus repair Left early 2000s  . Tonsillectomy    . Ercp    . Colonoscopy    . Hemorrhoid surgery  2011  . Carpal tunnel release Left 09/27/2013    Procedure: LEFT WRIST CARPAL TUNNEL RELEASE;  Surgeon: Nicki Reaper, MD;  Location: [facility];  Service: Orthopedics;  Laterality: Left;  . Ulnar nerve transposition Left 09/27/2013    Procedure: RELEASE POSSIBLE TRANSPOSITION ULNAR NERVE LEFT ELBOW ;  Surgeon: Nicki Reaper, MD;  Location:  SUR[facility]ERY C[facility];  Service: Orthopedics;  Laterality: Left;   Social History   Social History  . Marital Status: Married    Spouse Name: N/A  . Number of Children: N/A  . Years of Education: N/A   Occupational History  . TRUCK DRIVER    Social History Main Topics  . Smoking status: Current Every Day Smoker -- 1.50 packs/day for 49 years    Types: Cigarettes    Start date: 04/13/1964  . Smokeless tobacco: Never Used     Comment: started at age 72  . Alcohol Use: No  . Drug Use: No  . Sexual Activity: Not Asked   Other Topics Concern  . None   Social History Narrative   Outpatient Encounter Prescriptions as of 03/12/2015  Medication Sig  . amLODipine (NORVASC) 10 MG tablet Take 10 mg by mouth daily.  . Exenatide ER 2 MG PEN Inject 2 mg into the skin once a week.  . fenofibrate 160 MG tablet Take 160  mg by mouth daily.  Marland Kitchen losartan-hydrochlorothiazide (HYZAAR) 100-12.5 MG tablet Take 1 tablet by mouth daily.  . meloxicam (MOBIC) 15 MG tablet Take 15 mg by mouth daily.  . metFORMIN (GLUCOPHAGE) 500 MG tablet Take 1,000 mg by mouth 2 (two) times daily after a meal.  . pantoprazole (PROTONIX) 40 MG tablet Take 40 mg by mouth daily.  . saxagliptin HCl (ONGLYZA) 5 MG TABS tablet Take 5 mg by mouth daily.  . [DISCONTINUED] pioglitazone (ACTOS) 30 MG tablet Take 30 mg by mouth daily.  Marland Kitchen omega-3 acid ethyl esters (LOVAZA) 1 G capsule Take 4 g by mouth daily.  . [DISCONTINUED] losartan (COZAAR) 50 MG tablet Take 50 mg by mouth daily.   No facility-administered encounter medications on file as of 03/12/2015.   ALLERGIES: Allergies  Allergen Reactions  . Statins     Leg cramps/liver problems  . Clindamycin/Lincomycin     Adverse effects   VACCINATION STATUS: Immunization History  Administered Date(s) Administered  . Influenza Split 02/08/2014  . Pneumococcal Polysaccharide-23 06/10/2012    Diabetes He presents for his initial diabetic visit. He has type 2 diabetes mellitus. Onset time: he was diagnosed at approx  age of 44 years. His disease course has been worsening. There are no hypoglycemic associated symptoms. Pertinent negatives for hypoglycemia include no confusion, headaches, pallor or seizures. Associated symptoms include blurred vision, polydipsia and polyuria. Pertinent negatives for diabetes include no chest pain, no fatigue, no polyphagia and no weakness. There are no hypoglycemic complications. Symptoms are worsening. Diabetic complications include peripheral neuropathy. Pertinent negatives for diabetic complications include no PVD. Risk factors for coronary artery disease include diabetes mellitus, dyslipidemia, hypertension, male sex, obesity, sedentary lifestyle and tobacco exposure. Current diabetic treatments: bydureon, actos, metformin, onglyza. He is following a generally  unhealthy diet. He has not had a previous visit with a dietitian. He never participates in exercise. His overall blood glucose range is >200 mg/dl. An ACE inhibitor/angiotensin II receptor blocker is being taken. Eye exam is current.  Hyperlipidemia This is a chronic problem. The current episode started more than 1 year ago. Pertinent negatives include no chest pain, myalgias or shortness of breath. Current antihyperlipidemic treatment includes fibric acid derivatives.  Hypertension This is a chronic problem. The current episode started more than 1 year ago. The problem is controlled. Associated symptoms include blurred vision. Pertinent negatives include no chest pain, headaches, neck pain, palpitations or shortness of breath. Past treatments include angiotensin blockers. There is no history of PVD.     Review of Systems  Constitutional: Negative for fatigue and unexpected weight change.  HENT: Negative for dental problem, mouth sores and trouble swallowing.   Eyes: Positive for blurred vision. Negative for visual disturbance.  Respiratory: Negative for cough, choking, chest tightness, shortness of breath and wheezing.   Cardiovascular: Negative for chest pain, palpitations and leg swelling.  Gastrointestinal: Negative for nausea, vomiting, abdominal pain, diarrhea, constipation and abdominal distention.  Endocrine: Positive for polydipsia and polyuria. Negative for polyphagia.  Genitourinary: Negative for dysuria, urgency, hematuria and flank pain.  Musculoskeletal: Negative for myalgias, back pain, gait problem and neck pain.  Skin: Negative for pallor, rash and wound.  Neurological: Negative for seizures, syncope, weakness, numbness and headaches.  Psychiatric/Behavioral: Negative.  Negative for confusion and dysphoric mood.    Objective:    BP 134/84 mmHg  Pulse 95  Ht  (1.803 m)  Wt 328 lb (148.78 kg)  BMI 45.77 kg/m2  SpO2 86%  Wt Readings from Last 3 Encounters:   03/12/15 328 lb (148.78 kg)  06/11/14 351 lb (159.213 kg)  09/27/13 337 lb 6.4 oz (153.044 kg)    Physical Exam  Constitutional: He is oriented to person, place, and time. He appears well-developed and well-nourished. He is cooperative. No distress.  HENT:  Head: Normocephalic and atraumatic.  Eyes: EOM are normal.  Neck: Normal range of motion. Neck supple. No tracheal deviation present. No thyromegaly present.  Cardiovascular: Normal rate, S1 normal, S2 normal and normal heart sounds.  Exam reveals no gallop.   No murmur heard. Pulses:      Dorsalis pedis pulses are 0 on the right side, and 0 on the left side.       Posterior tibial pulses are 0 on the right side, and 0 on the left side.  Pulmonary/Chest: Effort normal. No respiratory distress. He has wheezes in the right upper field, the right middle field, the right lower field, the left upper field, the left middle field and the left lower field. He has rales in the right lower field and the left lower field.  Abdominal: Soft. Bowel sounds are normal. He exhibits no distension. There is no tenderness.  There is no guarding and no CVA tenderness.  Musculoskeletal: He exhibits no edema.       Right shoulder: He exhibits no swelling and no deformity.  Neurological: He is alert and oriented to person, place, and time. He has normal strength and normal reflexes. No cranial nerve deficit or sensory deficit. Gait normal.  Skin: Skin is warm and dry. No rash noted. No cyanosis. Nails show no clubbing.  Psychiatric: He has a normal mood and affect. His speech is normal and behavior is normal. Judgment and thought content normal. Cognition and memory are normal.    Results for orders placed or performed in visit on 03/12/15  Hemoglobin A1c  Result Value Ref Range   Hgb A1c MFr Bld 10.6 (A) 4.0 - 6.0 %   Complete Blood Count (Most recent): Lab Results  Component Value Date   WBC 7.6 07/12/2009   HGB 15.3 09/27/2013   HCT 45.0 09/27/2013    MCV 89.2 07/12/2009   PLT 188 07/12/2009   Chemistry (most recent): Lab Results  Component Value Date   NA 141 09/27/2013   K 4.3 09/27/2013   CL 105 09/27/2013   CO2 28 07/12/2009   BUN 16 09/27/2013   CREATININE 0.80 09/27/2013   Diabetic Labs (most recent): Lab Results  Component Value Date   HGBA1C 10.6* 01/09/2015    Assessment & Plan:   1. Uncontrolled type 2 diabetes mellitus with complication, without long-term current use of insulin (HCC)  - Patient has currently uncontrolled symptomatic type 2 DM since  [age]years of age,  with most recent A1c of  10.6%. Recent labs reviewed.   -His  diabetes is complicated by PAD and patient remains at a high risk for more acute and chronic complications of diabetes which include CAD, CVA, CKD, retinopathy, and neuropathy. These are all discussed in detail with the patient.  - I have counseled the patient on diet management and weight loss, by adopting a carbohydrate restricted/protein rich diet.  - Suggestion is made for patient to avoid simple carbohydrates   from their diet including Cakes , Desserts, Ice Cream,  Soda (  diet and regular) , Sweet Tea , Candies,  Chips, Cookies, Artificial Sweeteners,   and "Sugar-free" Products . This will help patient to have stable blood glucose profile and potentially avoid unintended weight gain.  - I encouraged the patient to switch to  unprocessed or minimally processed complex starch and increased protein intake (animal or plant source), fruits, and vegetables.  - Patient is advised to stick to a routine mealtimes to eat 3 meals  a day and avoid unnecessary snacks ( to snack only to correct hypoglycemia).  - The patient will be scheduled with Norm SaltPenny Crumpton, RDN, CDE for individualized DM education.  - I have approached patient with the following individualized plan to manage diabetes and patient agrees:  -I approached him to start monitoring BG AC and HS for a week. -he will continue  Bydureon 2mg  weekly. -I advised him to d/c Actos. -he will likely need insulin therapy , will d/c Onglyza next visit. 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 -I advised him to continue Metformin 1000mg  po BID. -Ideal therapy for him will be bariatric surgery, will discuss next visit.   - Patient specific target  A1c;  LDL, HDL, Triglycerides, and  Waist Circumference were discussed in detail.  2) BP/HTN: Controlled. Continue current medications including ACEI/ARB. 3) Lipids/HPL:  Not on  Statins, continue  fenofibrate. 4)  Weight/Diet: CDE Consult will be initiated , exercise, and detailed carbohydrates information provided.  5) Chronic Care/Health Maintenance:  -Patient is encouraged to continue to follow up with Ophthalmology, Podiatrist at least yearly or according to recommendations, and advised to  Quit smoking ( SPo2 86%). I have recommended yearly flu vaccine and pneumonia vaccination at least every 5 years; moderate intensity exercise for up to 150 minutes weekly; and  sleep for at least 7 hours a day.  - 60 minutes of time was spent on the care of this patient , 50% of which was applied for counseling on diabetes complications and their preventions.  - Patient to bring meter and  blood glucose logs during their next visit. -I urged him to quit smoking, he has wheezing indicating COPD. Unfortunately , he is not ready to quit. - I advised patient to maintain close follow up with Pamelia Hoit, MD for primary care needs.  Follow up plan: - Return in about 1 week (around 03/19/2015) for diabetes, high blood pressure, high cholesterol.  Marquis Lunch, MD Phone: [phone]  Fax: [phone]   03/12/2015, 9:50 PM

## 2015-03-12 NOTE — Patient Instructions (Signed)

## 2015-03-15 ENCOUNTER — Other Ambulatory Visit: Payer: Self-pay

## 2015-03-15 MED ORDER — GLUCOSE BLOOD VI STRP: ORAL_STRIP | Status: AC

## 2015-03-19 ENCOUNTER — Encounter: Payer: Self-pay | Admitting: "Endocrinology

## 2015-03-19 ENCOUNTER — Ambulatory Visit (INDEPENDENT_AMBULATORY_CARE_PROVIDER_SITE_OTHER): Payer: Medicare Other | Admitting: "Endocrinology

## 2015-03-19 VITALS — BP 144/84 | HR 83 | Ht 71.0 in | Wt 332.0 lb

## 2015-03-19 DIAGNOSIS — I10 Essential (primary) hypertension: Secondary | ICD-10-CM

## 2015-03-19 DIAGNOSIS — E785 Hyperlipidemia, unspecified: Secondary | ICD-10-CM | POA: Diagnosis not present

## 2015-03-19 DIAGNOSIS — E669 Obesity, unspecified: Secondary | ICD-10-CM | POA: Diagnosis not present

## 2015-03-19 DIAGNOSIS — E1165 Type 2 diabetes mellitus with hyperglycemia: Secondary | ICD-10-CM | POA: Diagnosis not present

## 2015-03-19 DIAGNOSIS — IMO0002 Reserved for concepts with insufficient information to code with codable children: Secondary | ICD-10-CM

## 2015-03-19 DIAGNOSIS — E118 Type 2 diabetes mellitus with unspecified complications: Secondary | ICD-10-CM | POA: Diagnosis not present

## 2015-03-19 MED ORDER — CANAGLIFLOZIN 100 MG PO TABS: 100.0000 mg | ORAL_TABLET | Freq: Every day | ORAL | Status: DC

## 2015-03-19 NOTE — Patient Instructions (Signed)

## 2015-03-19 NOTE — Progress Notes (Signed)
Subjective:    Patient ID: Andrew Mercer, male    DOB: [birth year]. Patient is being seen in consultation for management of diabetes requested by  Pamelia Hoit, MD  Past Medical History  Diagnosis Date  . HTN (hypertension)   . Diabetes (HCC)     TYPE II  . Hyperlipidemia   . Seasonal allergies   . Wears dentures     top  . Wears glasses     reading  . GERD (gastroesophageal reflux disease)   . Sleep apnea     uses a cpap   Past Surgical History  Procedure Laterality Date  . Nasal sinus surgery  1998    x2  . Parotid gland tumor excision Bilateral 2012  . Carpal tunnel release Bilateral   . Meniscus repair Left early 2000s  . Tonsillectomy    . Ercp    . Colonoscopy    . Hemorrhoid surgery  2011  . Carpal tunnel release Left 09/27/2013    Procedure: LEFT WRIST CARPAL TUNNEL RELEASE;  Surgeon: Nicki Reaper, MD;  Location: [facility];  Service: Orthopedics;  Laterality: Left;  . Ulnar nerve transposition Left 09/27/2013    Procedure: RELEASE POSSIBLE TRANSPOSITION ULNAR NERVE LEFT ELBOW ;  Surgeon: Nicki Reaper, MD;  Location: [facility];  Service: Orthopedics;  Laterality: Left;   Social History   Social History  . Marital Status: Married    Spouse Name: N/A  . Number of Children: N/A  . Years of Education: N/A   Occupational History  . TRUCK DRIVER    Social History Main Topics  . Smoking status: Current Every Day Smoker -- 1.50 packs/day for 49 years    Types: Cigarettes    Start date: 04/13/1964  . Smokeless tobacco: Never Used     Comment: started at age 61  . Alcohol Use: No  . Drug Use: No  . Sexual Activity: Not Asked   Other Topics Concern  . None   Social History Narrative   Outpatient Encounter Prescriptions as of 03/19/2015  Medication Sig  . amLODipine (NORVASC) 10 MG tablet Take 10 mg by mouth daily.  . canagliflozin (INVOKANA) 100 MG TABS tablet Take 1 tablet (100 mg total) by mouth daily before  breakfast.  . Exenatide ER 2 MG PEN Inject 2 mg into the skin once a week.  . fenofibrate 160 MG tablet Take 160 mg by mouth daily.  Marland Kitchen glucose blood test strip Use as instructed 4x daily One Touch ultra  . losartan-hydrochlorothiazide (HYZAAR) 100-12.5 MG tablet Take 1 tablet by mouth daily.  . meloxicam (MOBIC) 15 MG tablet Take 15 mg by mouth daily.  . metFORMIN (GLUCOPHAGE) 500 MG tablet Take 1,000 mg by mouth 2 (two) times daily after a meal.  . omega-3 acid ethyl esters (LOVAZA) 1 G capsule Take 4 g by mouth daily.  . pantoprazole (PROTONIX) 40 MG tablet Take 40 mg by mouth daily.  . saxagliptin HCl (ONGLYZA) 5 MG TABS tablet Take 5 mg by mouth daily.  . [DISCONTINUED] canagliflozin (INVOKANA) 100 MG TABS tablet Take 1 tablet (100 mg total) by mouth daily before breakfast.   No facility-administered encounter medications on file as of 03/19/2015.   ALLERGIES: Allergies  Allergen Reactions  . Statins     Leg cramps/liver problems  . Clindamycin/Lincomycin     Adverse effects   VACCINATION STATUS: Immunization History  Administered Date(s) Administered  . Influenza Split 02/08/2014  . Pneumococcal Polysaccharide-23  06/10/2012    Diabetes He presents for his follow-up diabetic visit. He has type 2 diabetes mellitus. Onset time: he was diagnosed at approx age of 44 years. His disease course has been worsening. There are no hypoglycemic associated symptoms. Pertinent negatives for hypoglycemia include no confusion, headaches, pallor or seizures. Associated symptoms include blurred vision, polydipsia and polyuria. Pertinent negatives for diabetes include no chest pain, no fatigue, no polyphagia and no weakness. There are no hypoglycemic complications. Symptoms are worsening. Diabetic complications include peripheral neuropathy. Pertinent negatives for diabetic complications include no PVD. Risk factors for coronary artery disease include diabetes mellitus, dyslipidemia, hypertension, male  sex, obesity, sedentary lifestyle and tobacco exposure. Current diabetic treatments: bydureon,  metformin, onglyza. His weight is increasing steadily. He is following a generally unhealthy diet. He has not had a previous visit with a dietitian. He never participates in exercise. His breakfast blood glucose range is generally >200 mg/dl. His lunch blood glucose range is generally 180-200 mg/dl. His dinner blood glucose range is generally 180-200 mg/dl. His overall blood glucose range is >200 mg/dl. An ACE inhibitor/angiotensin II receptor blocker is being taken. Eye exam is current.  Hyperlipidemia This is a chronic problem. The current episode started more than 1 year ago. Pertinent negatives include no chest pain, myalgias or shortness of breath. Current antihyperlipidemic treatment includes fibric acid derivatives.  Hypertension This is a chronic problem. The current episode started more than 1 year ago. The problem is controlled. Associated symptoms include blurred vision. Pertinent negatives include no chest pain, headaches, neck pain, palpitations or shortness of breath. Past treatments include angiotensin blockers. There is no history of PVD.     Review of Systems  Constitutional: Negative for fatigue and unexpected weight change.  HENT: Negative for dental problem, mouth sores and trouble swallowing.   Eyes: Positive for blurred vision. Negative for visual disturbance.  Respiratory: Negative for cough, choking, chest tightness, shortness of breath and wheezing.   Cardiovascular: Negative for chest pain, palpitations and leg swelling.  Gastrointestinal: Negative for nausea, vomiting, abdominal pain, diarrhea, constipation and abdominal distention.  Endocrine: Positive for polydipsia and polyuria. Negative for polyphagia.  Genitourinary: Negative for dysuria, urgency, hematuria and flank pain.  Musculoskeletal: Negative for myalgias, back pain, gait problem and neck pain.  Skin: Negative for  pallor, rash and wound.  Neurological: Negative for seizures, syncope, weakness, numbness and headaches.  Psychiatric/Behavioral: Negative.  Negative for confusion and dysphoric mood.    Objective:    BP 144/84 mmHg  Pulse 83  Ht  (1.803 m)  Wt 332 lb (150.594 kg)  BMI 46.33 kg/m2  SpO2 95%  Wt Readings from Last 3 Encounters:  03/19/15 332 lb (150.594 kg)  03/12/15 328 lb (148.78 kg)  06/11/14 351 lb (159.213 kg)    Physical Exam  Constitutional: He is oriented to person, place, and time. He appears well-developed and well-nourished. He is cooperative. No distress.  HENT:  Head: Normocephalic and atraumatic.  Eyes: EOM are normal.  Neck: Normal range of motion. Neck supple. No tracheal deviation present. No thyromegaly present.  Cardiovascular: Normal rate, S1 normal, S2 normal and normal heart sounds.  Exam reveals no gallop.   No murmur heard. Pulses:      Dorsalis pedis pulses are 0 on the right side, and 0 on the left side.       Posterior tibial pulses are 0 on the right side, and 0 on the left side.  Pulmonary/Chest: Effort normal. No respiratory distress. He has  wheezes in the right upper field, the right middle field, the right lower field, the left upper field, the left middle field and the left lower field. He has rales in the right lower field and the left lower field.  Abdominal: Soft. Bowel sounds are normal. He exhibits no distension. There is no tenderness. There is no guarding and no CVA tenderness.  Musculoskeletal: He exhibits no edema.       Right shoulder: He exhibits no swelling and no deformity.  Neurological: He is alert and oriented to person, place, and time. He has normal strength and normal reflexes. No cranial nerve deficit or sensory deficit. Gait normal.  Skin: Skin is warm and dry. No rash noted. No cyanosis. Nails show no clubbing.  Psychiatric: He has a normal mood and affect. His speech is normal and behavior is normal. Judgment and thought  content normal. Cognition and memory are normal.    Results for orders placed or performed in visit on 03/12/15  Hemoglobin A1c  Result Value Ref Range   Hgb A1c MFr Bld 10.6 (A) 4.0 - 6.0 %   Complete Blood Count (Most recent): Lab Results  Component Value Date   WBC 7.6 07/12/2009   HGB 15.3 09/27/2013   HCT 45.0 09/27/2013   MCV 89.2 07/12/2009   PLT 188 07/12/2009   Chemistry (most recent): Lab Results  Component Value Date   NA 141 09/27/2013   K 4.3 09/27/2013   CL 105 09/27/2013   CO2 28 07/12/2009   BUN 16 09/27/2013   CREATININE 0.80 09/27/2013   Diabetic Labs (most recent): Lab Results  Component Value Date   HGBA1C 10.6* 01/09/2015    Assessment & Plan:   1. Uncontrolled type 2 diabetes mellitus with complication, without long-term current use of insulin (HCC)  - Patient has currently uncontrolled symptomatic type 2 DM since  [age]years of age,  with most recent A1c of  10.6%. Recent labs reviewed.   -His  diabetes is complicated by PAD and patient remains at a high risk for more acute and chronic complications of diabetes which include CAD, CVA, CKD, retinopathy, and neuropathy. These are all discussed in detail with the patient. -He came with uncontrolled glycemic profile greater than 200 at fasting, between 180 and 200 mg per DL at lunch, and greater than 200 pre-supper. - I have counseled the patient on diet management and weight loss, by adopting a carbohydrate restricted/protein rich diet.  - Suggestion is made for patient to avoid simple carbohydrates   from their diet including Cakes , Desserts, Ice Cream,  Soda (  diet and regular) , Sweet Tea , Candies,  Chips, Cookies, Artificial Sweeteners,   and "Sugar-free" Products . This will help patient to have stable blood glucose profile and potentially avoid unintended weight gain.  - I encouraged the patient to switch to  unprocessed or minimally processed complex starch and increased protein intake (animal  or plant source), fruits, and vegetables.  - Patient is advised to stick to a routine mealtimes to eat 3 meals  a day and avoid unnecessary snacks ( to snack only to correct hypoglycemia).  - The patient will be scheduled with Norm SaltPenny Crumpton, RDN, CDE for individualized DM education.  -Based on his blood glucose profile, he would need basal/bolus insulin to control diabetes. 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 -He states he will change in job and insurance from 04/14/2015 and wants to postpone insulin therapy  for then.  -I have approached patient with the following individualized plan to manage diabetes and patient agrees:  -he will continue Bydureon  weekly, DC Onglyza. -Start Invokana 100 mg by mouth every morning, side effects and precautions discussed with patient.  -I advised him to continue Metformin  po BID. -Ideal therapy for him will be bariatric surgery, however he is reluctant to consider this option. I gave him a contact person brochure in case he is interested . - Patient specific target  A1c;  LDL, HDL, Triglycerides, and  Waist Circumference were discussed in detail.  2) BP/HTN: Controlled. Continue current medications including ACEI/ARB. 3) Lipids/HPL:  Not on  Statins, continue fenofibrate. 4)  Weight/Diet: CDE Consult will be initiated , exercise, and detailed carbohydrates information provided.  5) Chronic Care/Health Maintenance:  -Patient is encouraged to continue to follow up with Ophthalmology, Podiatrist at least yearly or according to recommendations, and advised to  Quit smoking ( SPo2 86%). I have recommended yearly flu vaccine and pneumonia vaccination at least every 5 years; moderate intensity exercise for up to 150 minutes weekly; and  sleep for at least 7 hours a day.  - 30 minutes of time was spent on the care of this patient , 50% of which was applied for counseling on diabetes complications and their  preventions, and smoking cessation.  -I urged him to quit smoking, he has wheezing indicating COPD. Unfortunately , he is not ready to quit.  - I advised patient to maintain close follow up with Pamelia Hoit, MD for primary care needs.  Follow up plan: - Return in about 4 weeks (around 04/16/2015) for diabetes, high blood pressure, high cholesterol, follow up with pre-visit labs.  Marquis Lunch, MD Phone: [phone]  Fax: [phone]   03/19/2015, 8:59 AM

## 2015-03-29 ENCOUNTER — Ambulatory Visit: Payer: Self-pay | Admitting: "Endocrinology

## 2015-04-09 ENCOUNTER — Other Ambulatory Visit: Payer: Self-pay | Admitting: "Endocrinology

## 2015-04-09 LAB — LIPID PANEL
Cholesterol: 271 mg/dL — ABNORMAL HIGH (ref 125–200)
HDL: 34 mg/dL — ABNORMAL LOW (ref >=40)
Total CHOL/HDL Ratio: 8 Ratio — ABNORMAL HIGH (ref <=5.0)
Triglycerides: 460 mg/dL — ABNORMAL HIGH (ref <150)

## 2015-04-09 LAB — BASIC METABOLIC PANEL
BUN: 16 mg/dL (ref 7–25)
CO2: 23 mmol/L (ref 20–31)
Calcium: 10.1 mg/dL (ref 8.6–10.3)
Chloride: 99 mmol/L (ref 98–110)
Creat: 1 mg/dL (ref 0.70–1.25)
Glucose, Bld: 188 mg/dL — ABNORMAL HIGH (ref 65–99)
Potassium: 4.9 mmol/L (ref 3.5–5.3)
Sodium: 135 mmol/L (ref 135–146)

## 2015-04-09 LAB — HEMOGLOBIN A1C
Hgb A1c MFr Bld: 10.1 % — ABNORMAL HIGH (ref <5.7)
Mean Plasma Glucose: 243 mg/dL — ABNORMAL HIGH (ref <117)

## 2015-04-09 LAB — TSH: TSH: 1.189 u[IU]/mL (ref 0.350–4.500)

## 2015-04-09 LAB — T4, FREE: Free T4: 1.31 ng/dL (ref 0.80–1.80)

## 2015-04-16 ENCOUNTER — Encounter: Payer: Self-pay | Admitting: "Endocrinology

## 2015-04-16 ENCOUNTER — Ambulatory Visit (INDEPENDENT_AMBULATORY_CARE_PROVIDER_SITE_OTHER): Payer: Medicare Other | Admitting: "Endocrinology

## 2015-04-16 VITALS — BP 138/87 | HR 86 | Ht 71.0 in | Wt 323.0 lb

## 2015-04-16 DIAGNOSIS — E785 Hyperlipidemia, unspecified: Secondary | ICD-10-CM

## 2015-04-16 DIAGNOSIS — E1165 Type 2 diabetes mellitus with hyperglycemia: Secondary | ICD-10-CM | POA: Diagnosis not present

## 2015-04-16 DIAGNOSIS — I10 Essential (primary) hypertension: Secondary | ICD-10-CM

## 2015-04-16 DIAGNOSIS — IMO0002 Reserved for concepts with insufficient information to code with codable children: Secondary | ICD-10-CM

## 2015-04-16 DIAGNOSIS — E118 Type 2 diabetes mellitus with unspecified complications: Secondary | ICD-10-CM

## 2015-04-16 MED ORDER — INSULIN GLARGINE 100 UNIT/ML SOLOSTAR PEN: 20.0000 [IU] | PEN_INJECTOR | Freq: Every day | SUBCUTANEOUS | Status: DC

## 2015-04-16 MED ORDER — EXENATIDE ER 2 MG ~~LOC~~ PEN: 2.0000 mg | PEN_INJECTOR | SUBCUTANEOUS | Status: DC

## 2015-04-16 NOTE — Progress Notes (Signed)
Subjective:    Patient ID: Andrew Mercer, male    DOB: December 24, 1950. Patient is being seen in consultation for management of diabetes requested by  Pamelia Hoit, MD  Past Medical History  Diagnosis Date  . HTN (hypertension)   . Diabetes (HCC)     TYPE II  . Hyperlipidemia   . Seasonal allergies   . Wears dentures     top  . Wears glasses     reading  . GERD (gastroesophageal reflux disease)   . Sleep apnea     uses a cpap   Past Surgical History  Procedure Laterality Date  . Nasal sinus surgery  1998    x2  . Parotid gland tumor excision Bilateral 2012  . Carpal tunnel release Bilateral   . Meniscus repair Left early 2000s  . Tonsillectomy    . Ercp    . Colonoscopy    . Hemorrhoid surgery  2011  . Carpal tunnel release Left 09/27/2013    Procedure: LEFT WRIST CARPAL TUNNEL RELEASE;  Surgeon: Nicki Reaper, MD;  Location: [facility];  Service: Orthopedics;  Laterality: Left;  . Ulnar nerve transposition Left 09/27/2013    Procedure: RELEASE POSSIBLE TRANSPOSITION ULNAR NERVE LEFT ELBOW ;  Surgeon: Nicki Reaper, MD;  Location: [facility];  Service: Orthopedics;  Laterality: Left;   Social History   Social History  . Marital Status: Married    Spouse Name: N/A  . Number of Children: N/A  . Years of Education: N/A   Occupational History  . TRUCK DRIVER    Social History Main Topics  . Smoking status: Current Every Day Smoker -- 1.50 packs/day for 49 years    Types: Cigarettes    Start date: 04/13/1964  . Smokeless tobacco: Never Used     Comment: started at age 14  . Alcohol Use: No  . Drug Use: No  . Sexual Activity: Not Asked   Other Topics Concern  . None   Social History Narrative   Outpatient Encounter Prescriptions as of 04/16/2015  Medication Sig  . amLODipine (NORVASC) 10 MG tablet Take 10 mg by mouth daily.  . canagliflozin (INVOKANA) 100 MG TABS tablet Take 1 tablet (100 mg total) by mouth daily before  breakfast.  . Exenatide ER 2 MG PEN Inject 2 mg into the skin once a week.  . fenofibrate 160 MG tablet Take 160 mg by mouth daily.  Marland Kitchen glucose blood test strip Use as instructed 4x daily One Touch ultra  . losartan-hydrochlorothiazide (HYZAAR) 100-12.5 MG tablet Take 1 tablet by mouth daily.  . meloxicam (MOBIC) 15 MG tablet Take 15 mg by mouth daily.  . metFORMIN (GLUCOPHAGE) 500 MG tablet Take 1,000 mg by mouth 2 (two) times daily after a meal.  . omega-3 acid ethyl esters (LOVAZA) 1 G capsule Take 4 g by mouth daily.  . pantoprazole (PROTONIX) 40 MG tablet Take 40 mg by mouth daily.  . [DISCONTINUED] Exenatide ER 2 MG PEN Inject 2 mg into the skin once a week.  . Insulin Glargine (LANTUS SOLOSTAR) 100 UNIT/ML Solostar Pen Inject 20 Units into the skin daily at 10 pm.  . [DISCONTINUED] saxagliptin HCl (ONGLYZA) 5 MG TABS tablet Take 5 mg by mouth daily.   No facility-administered encounter medications on file as of 04/16/2015.   ALLERGIES: Allergies  Allergen Reactions  . Statins     Leg cramps/liver problems  . Clindamycin/Lincomycin     Adverse effects  VACCINATION STATUS: Immunization History  Administered Date(s) Administered  . Influenza Split 02/08/2014  . Pneumococcal Polysaccharide-23 06/10/2012    Diabetes He presents for his follow-up diabetic visit. He has type 2 diabetes mellitus. Onset time: he was diagnosed at approx age of 44 years. His disease course has been improving. There are no hypoglycemic associated symptoms. Pertinent negatives for hypoglycemia include no confusion, headaches, pallor or seizures. Associated symptoms include blurred vision, polydipsia and polyuria. Pertinent negatives for diabetes include no chest pain, no fatigue, no polyphagia and no weakness. There are no hypoglycemic complications. Symptoms are improving. Diabetic complications include peripheral neuropathy. Pertinent negatives for diabetic complications include no PVD. Risk factors for  coronary artery disease include diabetes mellitus, dyslipidemia, hypertension, male sex, obesity, sedentary lifestyle and tobacco exposure. Current diabetic treatments: bydureon,  metformin, onglyza. His weight is increasing steadily. He is following a generally unhealthy diet. He has not had a previous visit with a dietitian. He never participates in exercise. Home blood sugar record trend: A1c still high at 10.1%. His breakfast blood glucose range is generally >200 mg/dl. His lunch blood glucose range is generally 180-200 mg/dl. His dinner blood glucose range is generally 180-200 mg/dl. His overall blood glucose range is >200 mg/dl. An ACE inhibitor/angiotensin II receptor blocker is being taken. Eye exam is current.  Hyperlipidemia This is a chronic problem. The current episode started more than 1 year ago. Pertinent negatives include no chest pain, myalgias or shortness of breath. Current antihyperlipidemic treatment includes fibric acid derivatives.  Hypertension This is a chronic problem. The current episode started more than 1 year ago. The problem is controlled. Associated symptoms include blurred vision. Pertinent negatives include no chest pain, headaches, neck pain, palpitations or shortness of breath. Past treatments include angiotensin blockers. There is no history of PVD.     Review of Systems  Constitutional: Negative for fatigue and unexpected weight change.  HENT: Negative for dental problem, mouth sores and trouble swallowing.   Eyes: Positive for blurred vision. Negative for visual disturbance.  Respiratory: Negative for cough, choking, chest tightness, shortness of breath and wheezing.   Cardiovascular: Negative for chest pain, palpitations and leg swelling.  Gastrointestinal: Negative for nausea, vomiting, abdominal pain, diarrhea, constipation and abdominal distention.  Endocrine: Positive for polydipsia and polyuria. Negative for polyphagia.  Genitourinary: Negative for  dysuria, urgency, hematuria and flank pain.  Musculoskeletal: Negative for myalgias, back pain, gait problem and neck pain.  Skin: Negative for pallor, rash and wound.  Neurological: Negative for seizures, syncope, weakness, numbness and headaches.  Psychiatric/Behavioral: Negative.  Negative for confusion and dysphoric mood.    Objective:    BP 138/87 mmHg  Pulse 86  Ht 5\' 11"  (1.803 m)  Wt 323 lb (146.512 kg)  BMI 45.07 kg/m2  SpO2 95%  Wt Readings from Last 3 Encounters:  04/16/15 323 lb (146.512 kg)  03/19/15 332 lb (150.594 kg)  03/12/15 328 lb (148.78 kg)    Physical Exam  Constitutional: He is oriented to person, place, and time. He appears well-developed and well-nourished. He is cooperative. No distress.  HENT:  Head: Normocephalic and atraumatic.  Eyes: EOM are normal.  Neck: Normal range of motion. Neck supple. No tracheal deviation present. No thyromegaly present.  Cardiovascular: Normal rate, S1 normal, S2 normal and normal heart sounds.  Exam reveals no gallop.   No murmur heard. Pulses:      Dorsalis pedis pulses are 0 on the right side, and 0 on the left side.  Posterior tibial pulses are 0 on the right side, and 0 on the left side.  Pulmonary/Chest: Effort normal. No respiratory distress. He has wheezes in the right upper field, the right middle field, the right lower field, the left upper field, the left middle field and the left lower field. He has rales in the right lower field and the left lower field.  Abdominal: Soft. Bowel sounds are normal. He exhibits no distension. There is no tenderness. There is no guarding and no CVA tenderness.  Musculoskeletal: He exhibits no edema.       Right shoulder: He exhibits no swelling and no deformity.  Neurological: He is alert and oriented to person, place, and time. He has normal strength and normal reflexes. No cranial nerve deficit or sensory deficit. Gait normal.  Skin: Skin is warm and dry. No rash noted. No  cyanosis. Nails show no clubbing.  Psychiatric: He has a normal mood and affect. His speech is normal and behavior is normal. Judgment and thought content normal. Cognition and memory are normal.    Results for orders placed or performed in visit on 04/09/15  Basic metabolic panel  Result Value Ref Range   Sodium 135 135 - 146 mmol/L   Potassium 4.9 3.5 - 5.3 mmol/L   Chloride 99 98 - 110 mmol/L   CO2 23 20 - 31 mmol/L   Glucose, Bld 188 (H) 65 - 99 mg/dL   BUN 16 7 - 25 mg/dL   Creat 9.60 4.54 - 0.98 mg/dL   Calcium 11.9 8.6 - 14.7 mg/dL  Lipid panel  Result Value Ref Range   Cholesterol 271 (H) 125 - 200 mg/dL   Triglycerides 829 (H) <150 mg/dL   HDL 34 (L) >=56 mg/dL   Total CHOL/HDL Ratio 8.0 (H) <=5.0 Ratio   VLDL NOT CALC <30 mg/dL   LDL Cholesterol NOT CALC <130 mg/dL  TSH  Result Value Ref Range   TSH 1.189 0.350 - 4.500 uIU/mL  T4, free  Result Value Ref Range   Free T4 1.31 0.80 - 1.80 ng/dL  Hemoglobin O1H  Result Value Ref Range   Hgb A1c MFr Bld 10.1 (H) <5.7 %   Mean Plasma Glucose 243 (H) <117 mg/dL   Complete Blood Count (Most recent): Lab Results  Component Value Date   WBC 7.6 07/12/2009   HGB 15.3 09/27/2013   HCT 45.0 09/27/2013   MCV 89.2 07/12/2009   PLT 188 07/12/2009   Chemistry (most recent): Lab Results  Component Value Date   NA 135 04/09/2015   K 4.9 04/09/2015   CL 99 04/09/2015   CO2 23 04/09/2015   BUN 16 04/09/2015   CREATININE 1.00 04/09/2015   Diabetic Labs (most recent): Lab Results  Component Value Date   HGBA1C 10.1* 04/09/2015   HGBA1C 10.6* 01/09/2015    Assessment & Plan:   1. Uncontrolled type 2 diabetes mellitus with complication, without long-term current use of insulin (HCC)  - Patient has currently uncontrolled symptomatic type 2 DM since  65years of age,  with most recent A1c of  10.1% slightly improved from 10.6%. Recent labs reviewed.   -His  diabetes is complicated by PAD and patient remains at a high  risk for more acute and chronic complications of diabetes which include CAD, CVA, CKD, retinopathy, and neuropathy. These are all discussed in detail with the patient. -He came with uncontrolled glycemic profile greater than 200 at fasting, between 180 and 200 mg per DL at lunch, and greater  than 200 pre-supper. - I have counseled the patient on diet management and weight loss, by adopting a carbohydrate restricted/protein rich diet.  - Suggestion is made for patient to avoid simple carbohydrates   from their diet including Cakes , Desserts, Ice Cream,  Soda (  diet and regular) , Sweet Tea , Candies,  Chips, Cookies, Artificial Sweeteners,   and "Sugar-free" Products . This will help patient to have stable blood glucose profile and potentially avoid unintended weight gain.  - I encouraged the patient to switch to  unprocessed or minimally processed complex starch and increased protein intake (animal or plant source), fruits, and vegetables.  - Patient is advised to stick to a routine mealtimes to eat 3 meals  a day and avoid unnecessary snacks ( to snack only to correct hypoglycemia).  - The patient will be scheduled with Norm SaltPenny Crumpton, RDN, CDE for individualized DM education.  -Based on his blood glucose profile and A1c of 10.1%, he will need  At least basal insulin to control diabetes, and he agrees with plan.  -I have approached patient with the following individualized plan to manage diabetes and patient agrees:  -Initiate Lantus 20 units daily at bedtime associated with monitoring of blood glucose before meals and at bedtime. -he will continue Bydureon 2mg  weekly. -Continue Invokana 100 mg by mouth every morning, side effects and precautions discussed with patient.  -I advised him to continue Metformin 1000mg  po BID. -Ideal therapy for him will be bariatric surgery, however he is reluctant to consider this option. I gave him a contact person brochure in case he is interested . - Patient  specific target  A1c;  LDL, HDL, Triglycerides, and  Waist Circumference were discussed in detail.  2) BP/HTN: Controlled. Continue current medications including ACEI/ARB. 3) Lipids/HPL:  Not on  Statins, continue fenofibrate. 4)  Weight/Diet: CDE Consult will be initiated , exercise, and detailed carbohydrates information provided.  5) Chronic Care/Health Maintenance:  -Patient is encouraged to continue to follow up with Ophthalmology, Podiatrist at least yearly or according to recommendations, and advised to  Quit smoking ( SPo2 86%). I have recommended yearly flu vaccine and pneumonia vaccination at least every 5 years; moderate intensity exercise for up to 150 minutes weekly; and  sleep for at least 7 hours a day.  - 30 minutes of time was spent on the care of this patient , 50% of which was applied for counseling on diabetes complications and their preventions, and smoking cessation.  -I urged him to quit smoking, he has wheezing indicating COPD. Unfortunately , he is not ready to quit.  - I advised patient to maintain close follow up with Pamelia HoitWILSON,FRED HENRY, MD for primary care needs.  Follow up plan: - Return in about 3 months (around 07/15/2015) for diabetes, high blood pressure, high cholesterol, follow up with pre-visit labs, meter, and logs.  Marquis LunchGebre Gleen Ripberger, MD Phone: [phone]  Fax: [phone]   04/16/2015, 8:08 PM

## 2015-04-22 ENCOUNTER — Ambulatory Visit: Payer: Medicare Other | Admitting: Nutrition

## 2015-05-10 ENCOUNTER — Ambulatory Visit: Payer: Medicare Other | Admitting: "Endocrinology

## 2015-06-11 ENCOUNTER — Ambulatory Visit: Payer: Medicare Other | Admitting: Pulmonary Disease

## 2015-06-13 ENCOUNTER — Ambulatory Visit (INDEPENDENT_AMBULATORY_CARE_PROVIDER_SITE_OTHER): Payer: Medicare Other | Admitting: Internal Medicine

## 2015-06-13 ENCOUNTER — Encounter: Payer: Self-pay | Admitting: Internal Medicine

## 2015-06-13 VITALS — BP 134/82 | HR 75 | Ht 71.0 in | Wt 326.6 lb

## 2015-06-13 DIAGNOSIS — F17200 Nicotine dependence, unspecified, uncomplicated: Secondary | ICD-10-CM

## 2015-06-13 DIAGNOSIS — G4733 Obstructive sleep apnea (adult) (pediatric): Secondary | ICD-10-CM | POA: Diagnosis not present

## 2015-06-13 NOTE — Progress Notes (Signed)
   Subjective:    Patient ID: Andrew Mercer, male    DOB: 10-16-1950, 65 y.o.   MRN: [id number]  HPI 06/11/2014- Dr Shelle Iron The patient comes in today for follow-up of his obstructive sleep apnea. He is wearing C Pap compliantly by his download, with excellent control of his AHI. He is having some mask leak issues, and would like to look at a different type of mask. He feels that he is sleeping well with his device, with excellent daytime alertness. Of note, his weight is unchanged from the last visit.  06/13/2015-65 year old male smoker followed for OSA, complicated by tobacco use, obesity, DM 2 NPSG 2004, AHI 9/ hr CPAP auto 5-20/ [facility] FOLLOWS FOR: Wears CPAP nightly. Denies problems with mask/pressure. DME: [employer]; switching to [facility] per insurance.  Wife here He is very satisfied with CPAP and wife confirms download, stating he uses it all night every night. He continues to smoke without effort to stop. He remains obese without effort to lose. Denies active breathing discomfort except dyspnea if he walks briskly. His primary physician follows his breathing and chest x-rays.  Review of Systems  Constitutional: Negative for fever and unexpected weight change.  HENT: Negative for congestion, dental problem, ear pain, nosebleeds, postnasal drip, rhinorrhea, sinus pressure, sneezing, sore throat and trouble swallowing.   Eyes: Negative for redness and itching.  Respiratory: Negative for cough, chest tightness, shortness of breath and wheezing.   Cardiovascular: Negative for palpitations and leg swelling.  Gastrointestinal: Negative for nausea and vomiting.  Genitourinary: Negative for dysuria.  Musculoskeletal: Negative for joint swelling.  Skin: Negative for rash.  Neurological: Negative for headaches.  Hematological: Does not bruise/bleed easily.  Psychiatric/Behavioral: Negative for dysphoric mood. The patient is not nervous/anxious.       Objective:  OBJ-  Physical Exam General- Alert, Oriented, Affect-appropriate, Distress- none acute + obese weight is up to 326 pounds Skin- rash-none, lesions- none, excoriation- none Lymphadenopathy- none Head- atraumatic            Eyes- Gross vision intact, PERRLA, conjunctivae and secretions clear            Ears- Hearing, canals-normal            Nose- Clear, no-Septal dev, mucus, polyps, erosion, perforation             Throat- Mallampati II , mucosa clear , drainage- none, tonsils- atrophic Neck- flexible , trachea midline, no stridor , thyroid nl, carotid no bruit Chest - symmetrical excursion , unlabored           Heart/CV- RRR , no murmur , no gallop  , no rub, nl s1 s2                           - JVD- none , edema- none, stasis changes- none, varices- none           Lung- clear to P&A, wheeze-+ trace bibasilar, cough- none , dullness-none, rub- none           Chest wall-  Abd-  Br/ Gen/ Rectal- Not done, not indicated Extrem- cyanosis- none, clubbing, none, atrophy- none, strength- nl Neuro- grossly intact to observation  Assessment & Plan:

## 2015-06-13 NOTE — Patient Instructions (Signed)
Order- DME [facility] continue CPAP auto 5-20, mask of choice, humidifier, supplies, AirView   Dx OSA  Please try to stop smoking  Please call if we can help

## 2015-06-13 NOTE — Assessment & Plan Note (Signed)
He is quite satisfied with his status using CPAP auto 5-20. He is making an [employer] company change which we discussed.

## 2015-06-13 NOTE — Assessment & Plan Note (Signed)
We discussed the overlapping medical problems associated with smoking, obesity, diabetes and OSA. Support for smoking cessation.

## 2015-06-24 ENCOUNTER — Encounter: Payer: Self-pay | Admitting: Internal Medicine

## 2015-07-03 ENCOUNTER — Encounter: Payer: Medicare Other | Attending: Endocrinology | Admitting: *Deleted

## 2015-07-03 ENCOUNTER — Encounter: Payer: Self-pay | Admitting: *Deleted

## 2015-07-03 DIAGNOSIS — E119 Type 2 diabetes mellitus without complications: Secondary | ICD-10-CM

## 2015-07-03 DIAGNOSIS — E118 Type 2 diabetes mellitus with unspecified complications: Secondary | ICD-10-CM | POA: Diagnosis present

## 2015-07-03 NOTE — Progress Notes (Signed)
Diabetes Self-Management Education  Visit Type: First/Initial  Appt. Start Time: 0930 Appt. End Time: 1100  07/03/2015  Mr. Andrew Mercer, identified by name and date of birth, is a 65 y.o. male with a diagnosis of Diabetes: Type 2. Mr. Andrew Mercer presents with his wife [place]. 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 He finds this very distressing. He notes he states busy puttering around the house. His wife of 40 years does all the grocery shopping, food prep and plating of meals. Andrew Mercer states he has lost 20# since January 2017. Andrew Mercer recognizes that activity is his biggest challenge. 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  ASSESSMENT  There were no vitals taken for this visit. There is no weight on file to calculate BMI.      Diabetes Self-Management Education - 07/03/15 1111    Visit Information   Visit Type First/Initial   Initial Visit   Diabetes Type Type 2   Are you currently following a meal plan? Yes   What type of meal plan do you follow? Watching carbs and sugar   Are you taking your medications as prescribed? Yes   Date Diagnosed 2009   Health Coping   How would you rate your overall health? Good   Psychosocial Assessment   Patient Belief/Attitude about Diabetes Motivated to manage diabetes   Self-care barriers None   Self-management support Doctor's office;Family;CDE visits   Other persons present Patient;Spouse/SO  [place]   Patient Concerns Nutrition/Meal planning;Healthy Lifestyle;Weight Control;Support   Special Needs None   Preferred Learning Style No preference indicated   Learning Readiness Change in progress   How often do you need to have someone help you when you read instructions, pamphlets, or other written materials from your doctor or pharmacy? 3 - Sometimes   Complications   Last HgB A1C per patient/outside source 10.1 %   How often do you check your blood sugar? 1-2 times/day   Fasting Blood glucose range  (mg/dL) 161-096  045   Postprandial Blood glucose range (mg/dL) [phone];914-782  [phone]   Number of hypoglycemic episodes per month 0   Dietary Intake   Breakfast oatmeal less sugar, apple juice w/splenda "[place]", english muffin or biscuit, grapefruit, black coffee all day   Snack (morning) none   Lunch 2 sandwich, chicken salad or bologna & cheese, soup / 2 hot dogs & baked beans   Snack (afternoon) Bryers Carb Smart ice cream   Dinner vegetables, meat, break (whole wheat)   Snack (evening) hot choloate with milk & raisin toast   Beverage(s) coffee, [place] fruit juice, water, unsweet tea, rare diet soda   Exercise   Exercise Type ADL's   Patient Education   Previous Diabetes Education No   Disease state  Definition of diabetes, type 1 and 2, and the diagnosis of diabetes;Factors that contribute to the development of diabetes   Nutrition management  Role of diet in the treatment of diabetes and the relationship between the three main macronutrients and blood glucose level;Food label reading, portion sizes and measuring food.;Carbohydrate counting;Meal options for control of blood glucose level and chronic complications.   Physical activity and exercise  Role of exercise on diabetes management, blood pressure control and cardiac health.;Helped patient identify appropriate exercises in relation to his/her diabetes, diabetes complications and other health issue.  encouraged water exercise or stationary machines at [facility] with silver Sneakers Program   Medications Reviewed patients medication  for diabetes, action, purpose, timing of dose and side effects.   Monitoring Purpose and frequency of SMBG.;Daily foot exams   Acute complications Taught treatment of hypoglycemia - the 15 rule.   Chronic complications Assessed and discussed foot care and prevention of foot problems;Dental care;Retinopathy and reason for yearly dilated eye exams   Psychosocial adjustment Worked with patient to  identify barriers to care and solutions   Personal strategies to promote health Lifestyle issues that need to be addressed for better diabetes care  Wife waits on patient. Serves his meals to him   Individualized Goals (developed by patient)   Nutrition General guidelines for healthy choices and portions discussed   Medications take my medication as prescribed   Monitoring  test my blood glucose as discussed   Reducing Risk treat hypoglycemia with 15 grams of carbs if blood glucose less than 70mg /dL   Outcomes   Expected Outcomes Demonstrated interest in learning. Expect positive outcomes   Future DMSE PRN   Program Status Completed      Individualized Plan for Diabetes Self-Management Training:   Learning Objective:  Patient will have a greater understanding of diabetes self-management. Patient education plan is to attend individual and/or group sessions per assessed needs and concerns.   Plan:   Patient Instructions  Plan:  Aim for 4 Carb Choices per meal (60 grams) +/- 1 either way  Aim for 15 Carbs per snack if hungry  Include protein in moderation with your meals and snacks Consider reading food labels for Total Carbohydrate and Fat Grams of foods Consider  increasing your activity level by getting in the water for 30 minutes daily as tolerated Consider checking BG at alternate times per day as directed by MD  Continue taking medication as directed by MD  Consider Reduced calorie bread 45/50 calories which will also decrease your carbohydrates Lunch: consider 1 hot dog and bun and one hot dog with no bun. Decrease baked beans by a small bit  [facility] and getting in the [facility] for exercise and help your knees.  Come back and see me if you think I can help some more. It has been a plesure working with both of you.    Expected Outcomes:  Demonstrated interest in learning. Expect positive outcomes  Education material provided: Living Well with Diabetes,  Meal plan card, My Plate and Support group flyer  If problems or questions, patient to contact team via:  Phone  Future DSME appointment: PRN

## 2015-07-03 NOTE — Patient Instructions (Signed)
Plan:  Aim for 4 Carb Choices per meal (60 grams) +/- 1 either way  Aim for 15 Carbs per snack if hungry  Include protein in moderation with your meals and snacks Consider reading food labels for Total Carbohydrate and Fat Grams of foods Consider  increasing your activity level by getting in the water for 30 minutes daily as tolerated Consider checking BG at alternate times per day as directed by MD  Continue taking medication as directed by MD  Consider Reduced calorie bread 45/50 calories which will also decrease your carbohydrates Lunch: consider 1 hot dog and bun and one hot dog with no bun. Decrease baked beans by a small bit  consider [facility] and getting in the water for exercise and help your knees.  Come back and see me if you think I can help some more. It has been a plesure working with both of you.

## 2015-07-16 ENCOUNTER — Ambulatory Visit: Payer: Medicare Other | Admitting: "Endocrinology

## 2016-07-12 ENCOUNTER — Encounter: Payer: Self-pay | Admitting: Internal Medicine

## 2016-07-13 ENCOUNTER — Ambulatory Visit (INDEPENDENT_AMBULATORY_CARE_PROVIDER_SITE_OTHER): Payer: Medicare Other | Admitting: Internal Medicine

## 2016-07-13 ENCOUNTER — Encounter: Payer: Self-pay | Admitting: Internal Medicine

## 2016-07-13 VITALS — BP 128/68 | HR 87 | Ht 71.0 in | Wt 349.8 lb

## 2016-07-13 DIAGNOSIS — F17200 Nicotine dependence, unspecified, uncomplicated: Secondary | ICD-10-CM | POA: Diagnosis not present

## 2016-07-13 DIAGNOSIS — G4733 Obstructive sleep apnea (adult) (pediatric): Secondary | ICD-10-CM | POA: Diagnosis not present

## 2016-07-13 NOTE — Progress Notes (Signed)
Subjective:    Patient ID: Andrew Mercer, male    DOB: [birth year], 66 y.o.   MRN: [id number]  HPI   male smoker followed for OSA, complicated by tobacco use, Morbid obesity, DM 2 NPSG 2004, AHI 9/ hr ---------------------------------------------------------------------------- 06/13/2015-66 year old male smoker followed for OSA, complicated by tobacco use, obesity, DM 2 NPSG 2004, AHI 9/ hr CPAP auto 5-20/ [facility] FOLLOWS FOR: Wears CPAP nightly. Denies problems with mask/pressure. DME: [employer]; switching to [facility] per insurance.  Wife here He is very satisfied with CPAP and wife confirms download, stating he uses it all night every night. He continues to smoke without effort to stop. He remains obese without effort to lose. Denies active breathing discomfort except dyspnea if he walks briskly. His primary physician follows his breathing and chest x-rays.  07/13/2016-66 year old male smoker followed for OSA, complicated by tobacco use, obesity, DM 2 CPAP auto 5-20/ [facility] 349 pounds FOLLOWS FOR: DME: [facility]. Pt states he needs help with mask-would like daytime sleep center to have mask fit. Pt wears CPAP nightly and feels like he is not getting enough pressure. DL attached. His PCP follows his pulmonary status and did recent CXR as part of routine physical with no concerns reported. Overheating affects his sleep and we discussed adjustment for coming warm weather Download compliance 100%/4 hours, excellent control AHI 0.4/hour.  ROS-see HPI   Negative unless "+" Constitutional:    weight loss, night sweats, fevers, chills, fatigue, lassitude. HEENT:    headaches, difficulty swallowing, tooth/dental problems, sore throat,       sneezing, itching, ear ache, nasal congestion, post nasal drip, snoring CV:    chest pain, orthopnea, PND, swelling in lower extremities, anasarca,                                                         dizziness,  palpitations Resp:   +shortness of breath with exertion or at rest.                productive cough,   non-productive cough, coughing up of blood.              change in color of mucus.  wheezing.   Skin:    rash or lesions. GI:  No-   heartburn, indigestion, abdominal pain, nausea, vomiting, diarrhea,                 change in bowel habits, loss of appetite GU: dysuria, change in color of urine, no urgency or frequency.   flank pain. MS:   joint pain, stiffness, decreased range of motion, back pain. Neuro-     nothing unusual Psych:  change in mood or affect.  depression or anxiety.   memory loss.      Objective:  OBJ- Physical Exam         + Strong odor tobacco one room General- Alert, Oriented, Affect-appropriate, Distress- none acute + Morbidly obese weight is up to 349 pounds Skin- rash-none, lesions- none, excoriation- none Lymphadenopathy- none Head- atraumatic            Eyes- Gross vision intact, PERRLA, conjunctivae and secretions clear            Ears- Hearing, canals-normal            Nose- Clear,  no-Septal dev, mucus, polyps, erosion, perforation             Throat- Mallampati II , mucosa clear , drainage- none, tonsils- atrophic Neck- flexible , trachea midline, no stridor , thyroid nl, carotid no bruit Chest - symmetrical excursion , unlabored           Heart/CV- RRR , no murmur , no gallop  , no rub, nl s1 s2                           - JVD- none , edema- none, stasis changes- none, varices- none           Lung- clear to P&A, wheeze-none, cough- none , dullness-none, rub- none           Chest wall-  Abd-  Br/ Gen/ Rectal- Not done, not indicated Extrem- cyanosis- none, clubbing, none, atrophy- none, strength- nl Neuro- grossly intact to observation  Assessment & Plan:

## 2016-07-13 NOTE — Assessment & Plan Note (Signed)
He is gaining yet more weight. Consider bariatric referral

## 2016-07-13 NOTE — Patient Instructions (Addendum)
Order- referral to daytime [facility] for CPAP mask fit.   Ok to continue CPAP auto 5-20, mask of choice, humidifier, supplies, AirView    Dx OSA  Please call as needed

## 2016-07-13 NOTE — Assessment & Plan Note (Signed)
His combination of tobacco use, morbid obesity and diabetes carries grave prognosis. Smoking cessation strongly advised. This problem is primarily followed by his PCP who did recent CXR.

## 2016-07-13 NOTE — Assessment & Plan Note (Signed)
Compliance and control her good with AutoPap 5-20 despite his increasing weight. Mask doesn't fit and seal is comfortably and he asks help with mask refit. Plan- referral to daytime sleep center staff for mask fit assistance

## 2016-08-12 ENCOUNTER — Ambulatory Visit (HOSPITAL_BASED_OUTPATIENT_CLINIC_OR_DEPARTMENT_OTHER): Payer: Medicare Other | Attending: Internal Medicine | Admitting: Radiology

## 2016-08-12 DIAGNOSIS — G4733 Obstructive sleep apnea (adult) (pediatric): Secondary | ICD-10-CM

## 2017-03-16 ENCOUNTER — Telehealth: Payer: Self-pay | Admitting: Internal Medicine

## 2017-03-16 DIAGNOSIS — G4733 Obstructive sleep apnea (adult) (pediatric): Secondary | ICD-10-CM

## 2017-03-16 NOTE — Telephone Encounter (Signed)
I didn't seen any request from [facility].  Ok to order DME [facility]- replacement CPAP supplies     Dx OSA

## 2017-03-16 NOTE — Telephone Encounter (Addendum)
Ok will place order.   Ok for CPAP supplies CY? Order pended.

## 2017-03-16 NOTE — Addendum Note (Signed)
Addended by: Cydney OkAUGUSTIN, Beverlyn Mcginness N on: 03/16/2017 11:13 AM   Modules accepted: Orders

## 2017-03-16 NOTE — Telephone Encounter (Signed)
Synetta Failnita please advise if you have the DME for this pt on the cpap supplies.  Thanks

## 2017-03-24 NOTE — Telephone Encounter (Signed)
Sent to wrong POOL. Routing to right person.

## 2017-03-24 NOTE — Telephone Encounter (Signed)
PTs wife called today and stated [facility] has not received this yet.

## 2017-03-25 NOTE — Addendum Note (Signed)
Addended by: Cydney OkAUGUSTIN, Ezekiah Massie N on: 03/25/2017 12:16 PM   Modules accepted: Orders

## 2017-03-25 NOTE — Telephone Encounter (Signed)
I have just checked all the CMN's that I have for Dr. Maple HudsonYoung to sign and I don't have one for this patient. There hasn't been an order placed for CPAP supplies since 06/2015

## 2017-03-25 NOTE — Telephone Encounter (Signed)
Order placed for CPAP supplies. Pt notified and nothing further is needed.

## 2017-03-29 ENCOUNTER — Telehealth: Payer: Self-pay | Admitting: Internal Medicine

## 2017-03-29 DIAGNOSIS — G4733 Obstructive sleep apnea (adult) (pediatric): Secondary | ICD-10-CM

## 2017-03-29 NOTE — Telephone Encounter (Signed)
Spoke with SudanAlberta and she states she needs an order for a chin strap and water tank. CY can we send an order in for these items.

## 2017-03-29 NOTE — Telephone Encounter (Signed)
[facility] AT[facility] pt- no answer with no option to leave VM. Will call back

## 2017-03-29 NOTE — Telephone Encounter (Signed)
Yes please- dx OSA

## 2017-03-30 NOTE — Telephone Encounter (Signed)
Spoke with pt, aware of order.  Nothing further needed.

## 2017-07-13 ENCOUNTER — Encounter: Payer: Self-pay | Admitting: Internal Medicine

## 2017-07-13 ENCOUNTER — Ambulatory Visit (INDEPENDENT_AMBULATORY_CARE_PROVIDER_SITE_OTHER): Payer: Medicare Other | Admitting: Internal Medicine

## 2017-07-13 ENCOUNTER — Telehealth: Payer: Self-pay | Admitting: Internal Medicine

## 2017-07-13 VITALS — BP 120/70 | HR 86 | Ht 71.0 in | Wt 345.6 lb

## 2017-07-13 DIAGNOSIS — F17200 Nicotine dependence, unspecified, uncomplicated: Secondary | ICD-10-CM | POA: Diagnosis not present

## 2017-07-13 DIAGNOSIS — Z720 Tobacco use: Secondary | ICD-10-CM | POA: Diagnosis not present

## 2017-07-13 DIAGNOSIS — G4733 Obstructive sleep apnea (adult) (pediatric): Secondary | ICD-10-CM

## 2017-07-13 DIAGNOSIS — J449 Chronic obstructive pulmonary disease, unspecified: Secondary | ICD-10-CM | POA: Insufficient documentation

## 2017-07-13 MED ORDER — UMECLIDINIUM-VILANTEROL 62.5-25 MCG/INH IN AEPB: 1.0000 | INHALATION_SPRAY | Freq: Every day | RESPIRATORY_TRACT | 0 refills | Status: DC

## 2017-07-13 NOTE — Patient Instructions (Addendum)
Order- DME [facility]- please replace mask of choice, and supplies, continue CPAP auto 5-20, humidifier, AirView   If eligible, please replace old machine  Order- office spirometry   Dx tobacco user  Sample Anoro    1 puff, once daily. Call for script if helpful  Please call as needed

## 2017-07-13 NOTE — Progress Notes (Signed)
Subjective:    Patient ID: Andrew SavoyJoseph R Mercer, male    DOB: [birth year], 67 y.o.   MRN: [id number]  HPI   male smoker followed for OSA, COPD, complicated by tobacco use, Morbid obesity, DM 2, HBP, GERD NPSG 2004, AHI 9/ hr Office Spirometry 07/13/17-moderate obstruction, moderate restriction of exhaled volume likely reflecting both air trapping and obesity hypoventilation.  FVC 2.40/51%, FEV1 1.83/53%, ratio 0.76, FEF 25-75%, 1.56/ 59% ----------------------------------------------------------------------------  07/13/2016-67 year old male smoker followed for OSA, complicated by tobacco use, obesity, DM 2 CPAP auto 5-20/ [facility] Weight day 349 pounds FOLLOWS FOR: DME: [facility]. Pt states he needs help with mask-would like daytime sleep center to have mask fit. Pt wears CPAP nightly and feels like he is not getting enough pressure. DL attached. His PCP follows his pulmonary status and did recent CXR as part of routine physical with no concerns reported. Overheating affects his sleep and we discussed adjustment for coming warm weather Download compliance 100%/4 hours, excellent control AHI 0.4/hour.  07/13/17- 67 year old male smoker followed for OSA,COPD,  complicated by tobacco use, obesity, DM 2, HBP, GERD CPAP auto 5-20/ [facility] Weight today 345 pounds ----OSA. DME: [facility] Pt wears CPAP nightly and DL attached. Pt will need order for new supplies and rx to [facility] Says he will not sleep without CPAP.  Download 97% compliance AHI 0.8/hour.  Machine may be 68 years old. Still smoking.  Admits shortness of breath and admits very little physical activity since he retired.  Denies coughing. Office Spirometry 07/13/17-moderate obstruction, moderate restriction of exhaled volume likely reflecting both air trapping and obesity hypoventilation.  FVC 2.40/51%, FEV1 1.83/53%, ratio 0.76, FEF 25-75%, 1.56/ 59%  ROS-see HPI   Negative unless "+" Constitutional:    weight loss, night  sweats, fevers, chills, fatigue, lassitude. HEENT:    headaches, difficulty swallowing, tooth/dental problems, sore throat,       sneezing, itching, ear ache, nasal congestion, post nasal drip, snoring CV:    chest pain, orthopnea, PND, swelling in lower extremities, anasarca,                                                         dizziness, palpitations Resp:   +shortness of breath with exertion or at rest.                productive cough,   non-productive cough, coughing up of blood.              change in color of mucus.  wheezing.   Skin:    rash or lesions. GI:  No-   heartburn, indigestion, abdominal pain, nausea, vomiting, diarrhea,                 change in bowel habits, loss of appetite GU: dysuria, change in color of urine, no urgency or frequency.   flank pain. MS:   joint pain, stiffness, decreased range of motion, back pain. Neuro-     nothing unusual Psych:  change in mood or affect.  depression or anxiety.   memory loss.      Objective:  OBJ- Physical Exam         +tobacco stained mustache General- Alert, Oriented, Affect-appropriate, Distress- none acute + Morbidly obese weight is 345 lbs. Skin- rash-none, lesions- none, excoriation- none  Lymphadenopathy- none Head- atraumatic            Eyes- Gross vision intact, PERRLA, conjunctivae and secretions clear            Ears- Hearing, canals-normal            Nose- Clear, no-Septal dev, mucus, polyps, erosion, perforation             Throat- Mallampati III , mucosa clear , drainage- none, tonsils- atrophic Neck- flexible , trachea midline, no stridor , thyroid nl, carotid no bruit Chest - symmetrical excursion , unlabored           Heart/CV- RRR , no murmur , no gallop  , no rub, nl s1 s2                           - JVD- none , edema- none, stasis changes- none, varices- none           Lung-  wheeze+ trace, cough- none , dullness-none, rub- none           Chest wall-  Abd-  Br/ Gen/ Rectal- Not done, not  indicated Extrem- cyanosis- none, clubbing, none, atrophy- none, strength- nl Neuro- grossly intact to observation  Assessment & Plan:

## 2017-07-13 NOTE — Assessment & Plan Note (Signed)
Download results and his wife confirm his assertion that he uses CPAP every night and sleeps much better with it.  He cannot afford delay for replacement of machine breaks so we will see if he is eligible to replace this old machine now Plan-continue CPAP auto 5-20

## 2017-07-13 NOTE — Assessment & Plan Note (Signed)
He is not doing anything but sitting around smoking all day and has impressive tobacco staining of his mustache to prove it.  Smoking cessation strongly advised.

## 2017-07-13 NOTE — Telephone Encounter (Signed)
Called and spoke with patients wife. Advised her of Katies response. Patients wife verbalized understanding. Nothing further needed.

## 2017-07-13 NOTE — Addendum Note (Signed)
Addended by: Reynaldo MiniumWELCHEL, KATIE C on: 07/13/2017 11:51 AM   Modules accepted: Orders

## 2017-07-13 NOTE — Telephone Encounter (Signed)
Called and spoke with patients wife. She stated that Andrew Mercer told her in the OV this morning that she would be calling her with information on how patient can get his insulin and other medications for free.   I advised patient I would route a message to Florentina AddisonKatie to get help with this.   Andrew Mercer please advise patient is wanting to speak directly with you, thanks.

## 2017-07-13 NOTE — Telephone Encounter (Signed)
Pts wife was told to call the company that makes the insulin and ask them for the financial assistance program for Medicare patients. Thanks.

## 2017-07-13 NOTE — Assessment & Plan Note (Signed)
He is morbidly obese and does not seem prepared to make any lifestyle changes to improve this.

## 2018-05-24 ENCOUNTER — Emergency Department (HOSPITAL_COMMUNITY)
Admission: EM | Admit: 2018-05-24 | Discharge: 2018-05-24 | Disposition: A | Discharge status: Home / Self Care | Payer: Medicare Other | Attending: Emergency Medicine | Admitting: Emergency Medicine

## 2018-05-24 ENCOUNTER — Other Ambulatory Visit: Payer: Self-pay

## 2018-05-24 ENCOUNTER — Encounter (HOSPITAL_COMMUNITY): Payer: Self-pay | Admitting: Emergency Medicine

## 2018-05-24 DIAGNOSIS — F17210 Nicotine dependence, cigarettes, uncomplicated: Secondary | ICD-10-CM | POA: Diagnosis not present

## 2018-05-24 DIAGNOSIS — M109 Gout, unspecified: Secondary | ICD-10-CM

## 2018-05-24 DIAGNOSIS — E119 Type 2 diabetes mellitus without complications: Secondary | ICD-10-CM | POA: Insufficient documentation

## 2018-05-24 DIAGNOSIS — M10031 Idiopathic gout, right wrist: Secondary | ICD-10-CM | POA: Diagnosis not present

## 2018-05-24 DIAGNOSIS — Z794 Long term (current) use of insulin: Secondary | ICD-10-CM | POA: Insufficient documentation

## 2018-05-24 DIAGNOSIS — Z79899 Other long term (current) drug therapy: Secondary | ICD-10-CM | POA: Insufficient documentation

## 2018-05-24 DIAGNOSIS — J449 Chronic obstructive pulmonary disease, unspecified: Secondary | ICD-10-CM | POA: Insufficient documentation

## 2018-05-24 DIAGNOSIS — M25531 Pain in right wrist: Secondary | ICD-10-CM | POA: Diagnosis present

## 2018-05-24 DIAGNOSIS — I10 Essential (primary) hypertension: Secondary | ICD-10-CM | POA: Insufficient documentation

## 2018-05-24 HISTORY — DX: Gout, unspecified: M10.9

## 2018-05-24 MED ORDER — ONDANSETRON HCL 4 MG PO TABS
4.0000 mg | ORAL_TABLET | Freq: Once | ORAL | Status: AC
  Administered 2018-05-24: 4 mg via ORAL
  Filled 2018-05-24: qty 1

## 2018-05-24 MED ORDER — COLCHICINE 0.6 MG PO TABS
0.6000 mg | ORAL_TABLET | Freq: Once | ORAL | Status: AC
  Administered 2018-05-24: 0.6 mg via ORAL
  Filled 2018-05-24: qty 1

## 2018-05-24 MED ORDER — HYDROCODONE-ACETAMINOPHEN 5-325 MG PO TABS
1.0000 | ORAL_TABLET | Freq: Once | ORAL | Status: AC
  Administered 2018-05-24: 1 via ORAL
  Filled 2018-05-24: qty 1

## 2018-05-24 MED ORDER — TRAMADOL HCL 50 MG PO TABS: 50.0000 mg | ORAL_TABLET | Freq: Four times a day (QID) | ORAL | 0 refills | Status: DC | PRN

## 2018-05-24 NOTE — ED Provider Notes (Signed)
[facility] EMERGENCY DEPARTMENT Provider Note   CSN: [id number] Arrival date & time: 05/24/18  1209     History   Chief Complaint Chief Complaint  Patient presents with  . Wrist Pain    HPI Andrew Mercer is a 68 y.o. male.  Patient is a 68 year old male who presents to the emergency department with a complaint of right wrist and hand pain.  The patient states he awakened this morning to some mild swelling of his hand.  He has pain when he moves his fingers, and he also has pain when he moves his wrist.  No recent injury or trauma.  No recent operations or procedures.  He has not had any broken skin areas over the hand or the wrist.  Patient states he has a history of gout.  He has had gout issues in the toes and also in the knees, he says this is the first time he thinks that he has had in his wrist.  The history is provided by the patient and the spouse.  Wrist Pain  Pertinent negatives include no chest pain, no abdominal pain and no shortness of breath.    Past Medical History:  Diagnosis Date  . Diabetes (HCC)    TYPE II  . GERD (gastroesophageal reflux disease)   . Gout   . HTN (hypertension)   . Hyperlipidemia   . Seasonal allergies   . Sleep apnea    uses a cpap  . Wears dentures    top  . Wears glasses    reading    Patient Active Problem List   Diagnosis Date Noted  . COPD mixed type (HCC) 07/13/2017  . Tobacco use disorder 06/13/2015  . OSA (obstructive sleep apnea) 06/09/2013  . Type II diabetes mellitus, uncontrolled (HCC) 02/19/2007  . Hyperlipidemia 02/19/2007  . Obesity, morbid (HCC) 02/19/2007  . Essential hypertension, benign 02/19/2007  . DYSPNEA 02/19/2007  . COUGH 02/19/2007    Past Surgical History:  Procedure Laterality Date  . CARPAL TUNNEL RELEASE Bilateral   . CARPAL TUNNEL RELEASE Left 09/27/2013   Procedure: LEFT WRIST CARPAL TUNNEL RELEASE;  Surgeon: Nicki Reaper, MD;  Location: [facility];  Service:  Orthopedics;  Laterality: Left;  . COLONOSCOPY    . ERCP    . HEMORRHOID SURGERY  2011  . MENISCUS REPAIR Left early 2000s  . NASAL SINUS SURGERY  1998   x2  . PAROTID GLAND TUMOR EXCISION Bilateral 2012  . TONSILLECTOMY    . ULNAR NERVE TRANSPOSITION Left 09/27/2013   Procedure: RELEASE POSSIBLE TRANSPOSITION ULNAR NERVE LEFT ELBOW ;  Surgeon: Nicki Reaper, MD;  Location: [facility];  Service: Orthopedics;  Laterality: Left;        Home Medications    Prior to Admission medications   Medication Sig Start Date End Date Taking? Authorizing Provider  amLODipine (NORVASC) 10 MG tablet Take 10 mg by mouth daily.    [provider]  fenofibrate 160 MG tablet Take 160 mg by mouth daily.    [provider]  glucose blood test strip Use as instructed 4x daily One Touch ultra 03/15/15   Roma Kayser, MD  insulin lispro (HUMALOG KWIKPEN) 100 UNIT/ML KiwkPen Inject 20 Units into the skin 3 (three) times daily. 04/24/16   [provider]  losartan-hydrochlorothiazide (HYZAAR) 100-12.5 MG tablet Take 1 tablet by mouth daily.    [provider]  meloxicam (MOBIC) 15 MG tablet Take 15 mg by mouth  daily.    [provider]  metFORMIN (GLUCOPHAGE) 500 MG tablet Take 1,000 mg by mouth 2 (two) times daily after a meal.    [provider]  omega-3 acid ethyl esters (LOVAZA) 1 G capsule Take 4 g by mouth daily.    [provider]  pantoprazole (PROTONIX) 40 MG tablet Take 40 mg by mouth daily.    [provider]  TOUJEO SOLOSTAR 300 UNIT/ML SOPN Inject 75 Units into the skin 2 (two) times daily. 07/12/16   [provider]  umeclidinium-vilanterol (ANORO ELLIPTA) 62.5-25 MCG/INH AEPB Inhale 1 puff into the lungs daily. 07/13/17   Waymon BudgeYoung, Clinton D, MD    Family History Family History  Problem Relation Age of Onset  . Emphysema Mother   . Allergies Mother   . Asthma Mother   . Rheum arthritis Mother   .  Rheum arthritis Brother   . Rheum arthritis Sister   . Hyperlipidemia Sister   . Hypertension Father     Social History Social History   Tobacco Use  . Smoking status: Current Every Day Smoker    Packs/day: 1.50    Years: 49.00    Pack years: 73.50    Types: Cigarettes    Start date: 04/13/1964  . Smokeless tobacco: Never Used  . Tobacco comment: started at age 68  Substance Use Topics  . Alcohol use: No    Alcohol/week: 0.0 standard drinks  . Drug use: No     Allergies   Statins and Clindamycin/lincomycin   Review of Systems Review of Systems  Constitutional: Negative for activity change.       All ROS Neg except as noted in HPI  HENT: Negative for nosebleeds.   Eyes: Negative for photophobia and discharge.  Respiratory: Negative for cough, shortness of breath and wheezing.   Cardiovascular: Negative for chest pain and palpitations.  Gastrointestinal: Negative for abdominal pain and blood in stool.  Genitourinary: Negative for dysuria, frequency and hematuria.  Musculoskeletal: Positive for arthralgias. Negative for back pain and neck pain.  Skin: Negative.   Neurological: Negative for dizziness, seizures and speech difficulty.  Psychiatric/Behavioral: Negative for confusion and hallucinations.     Physical Exam Updated Vital Signs BP (!) 168/92 (BP Location: Left Arm)   Pulse 91   Temp 97.8 F (36.6 C) (Oral)   Resp (!) 22   Ht 6' (1.829 m)   Wt (!) 149.7 kg   SpO2 99%   BMI 44.76 kg/m   Physical Exam Vitals signs and nursing note reviewed.  Constitutional:      Appearance: He is well-developed. He is not toxic-appearing.  HENT:     Head: Normocephalic.     Right Ear: Tympanic membrane and external ear normal.     Left Ear: Tympanic membrane and external ear normal.  Eyes:     General: Lids are normal.     Pupils: Pupils are equal, round, and reactive to light.  Neck:     Musculoskeletal: Normal range of motion and neck supple.     Vascular: No  carotid bruit.  Cardiovascular:     Rate and Rhythm: Normal rate and regular rhythm.     Pulses: Normal pulses.     Heart sounds: Normal heart sounds.  Pulmonary:     Effort: No respiratory distress.     Breath sounds: Normal breath sounds.  Abdominal:     General: Bowel sounds are normal.     Palpations: Abdomen is soft.  Tenderness: There is no abdominal tenderness. There is no guarding.  Musculoskeletal: Normal range of motion.        General: Swelling and tenderness present.     Comments: There is good range of motion of the right shoulder and elbow.  There is some swelling of the right wrist.  There is pain with attempted flexion and extension of the wrist.  There is mild swelling of the right hand.  There is pain with attempted flexion and extension of the fingers.  Capillary refill is less than 2 seconds.  Radial pulse is 2+.  The compartments are all soft.  There is good range of motion of the left upper extremity.  There is noted some degenerative joint disease changes of the left upper extremity.  Lymphadenopathy:     Head:     Right side of head: No submandibular adenopathy.     Left side of head: No submandibular adenopathy.     Cervical: No cervical adenopathy.  Skin:    General: Skin is warm and dry.  Neurological:     Mental Status: He is alert and oriented to person, place, and time.     Cranial Nerves: No cranial nerve deficit.     Sensory: No sensory deficit.  Psychiatric:        Speech: Speech normal.      ED Treatments / Results  Labs (all labs ordered are listed, but only abnormal results are displayed) Labs Reviewed - No data to display  EKG None  Radiology No results found.  Procedures Procedures (including critical care time)  Medications Ordered in ED Medications  colchicine tablet 0.6 mg (has no administration in time range)  HYDROcodone-acetaminophen (NORCO/VICODIN) 5-325 MG per tablet 1 tablet (has no administration in time range)    ondansetron (ZOFRAN) tablet 4 mg (has no administration in time range)     Initial Impression / Assessment and Plan / ED Course  I have reviewed the triage vital signs and the nursing notes.  Pertinent labs & imaging results that were available during my care of the patient were reviewed by me and considered in my medical decision making (see chart for details).       Final Clinical Impressions(s) / ED Diagnoses MDM  Blood pressure is elevated.  Have asked the patient have this rechecked soon.  The examination favors an exacerbation of gout arthritis.  The patient is treated in the emergency department with colchicine and Norco.  On recheck, the patient states he is beginning to get a little relief from the pain, but states it is still present.  The patient's case has been discussed with Dr. Rhunette Croft.  The patient will use Tylenol extra strength every 4 hours over the next few days.  He is given a prescription for Ultram to use for more severe pain.  I have asked him to discuss this issue with Dr. Andrey Campanile, his primary physician.  The patient is invited to return to the emergency department if any changes in his condition, problems, or concerns.  Patient and wife are in agreement with this plan.   Final diagnoses:  Exacerbation of gout    ED Discharge Orders         Ordered    traMADol (ULTRAM) 50 MG tablet  Every 6 hours PRN     05/24/18 1411           Ivery Quale, PA-C 05/24/18 1419    Derwood Kaplan, MD 05/25/18 [phone]

## 2018-05-24 NOTE — ED Triage Notes (Signed)
Patient complaining of right wrist pain upon awakening this morning. Denies injury. States he has history of gout.

## 2018-05-24 NOTE — Discharge Instructions (Signed)
Please use Tylenol every 4 hours over the next couple of days.  Use Ultram every 6 hours for more severe pain.  This medication may cause drowsiness, please use caution getting around when taking this medication.  Please discuss this breakthrough pain with Dr. Andrey Campanile.  Please return to the emergency department if any emergent changes in your condition, problems, or concerns.

## 2018-07-14 ENCOUNTER — Ambulatory Visit: Payer: Self-pay | Admitting: Internal Medicine

## 2018-09-01 ENCOUNTER — Ambulatory Visit (INDEPENDENT_AMBULATORY_CARE_PROVIDER_SITE_OTHER): Payer: Medicare Other | Admitting: Internal Medicine

## 2018-09-01 ENCOUNTER — Ambulatory Visit (INDEPENDENT_AMBULATORY_CARE_PROVIDER_SITE_OTHER): Payer: Medicare Other

## 2018-09-01 ENCOUNTER — Other Ambulatory Visit: Payer: Self-pay

## 2018-09-01 ENCOUNTER — Encounter: Payer: Self-pay | Admitting: Internal Medicine

## 2018-09-01 VITALS — BP 136/70 | HR 79 | Temp 98.3°F | Ht 71.0 in | Wt 331.0 lb

## 2018-09-01 DIAGNOSIS — J449 Chronic obstructive pulmonary disease, unspecified: Secondary | ICD-10-CM

## 2018-09-01 DIAGNOSIS — G4733 Obstructive sleep apnea (adult) (pediatric): Secondary | ICD-10-CM

## 2018-09-01 DIAGNOSIS — F17200 Nicotine dependence, unspecified, uncomplicated: Secondary | ICD-10-CM

## 2018-09-01 MED ORDER — UMECLIDINIUM-VILANTEROL 62.5-25 MCG/INH IN AEPB: 1.0000 | INHALATION_SPRAY | Freq: Every day | RESPIRATORY_TRACT | 0 refills | Status: DC

## 2018-09-01 NOTE — Progress Notes (Signed)
Patient seen in the office today and instructed on use of Anoro.  Patient expressed understanding and demonstrated technique. 

## 2018-09-01 NOTE — Progress Notes (Signed)
Subjective:    Patient ID: Andrew Mercer, male    DOB: 1951-04-11, 68 y.o.   MRN: [id number]  HPI   male smoker followed for OSA, COPD, complicated by tobacco use, Morbid obesity, DM 2, HBP, GERD NPSG 2004, AHI 9/ hr Office Spirometry 07/13/17-moderate obstruction, moderate restriction of exhaled volume likely reflecting both air trapping and obesity hypoventilation.  FVC 2.40/51%, FEV1 1.83/53%, ratio 0.76, FEF 25-75%, 1.56/ 59% ----------------------------------------------------------------------------  07/13/17- 68 year old male smoker followed for OSA,COPD,  complicated by tobacco use, obesity, DM 2, HBP, GERD CPAP auto 5-20/ [facility] Weight today 345 pounds ----OSA. DME: [facility] Pt wears CPAP nightly and DL attached. Pt will need order for new supplies and rx to continue CPAP sent to [facility] for Medicare. Says he will not sleep without CPAP.  Download 97% compliance AHI 0.8/hour.  Machine may be 69 years old. Still smoking.  Admits shortness of breath and admits very little physical activity since he retired.  Denies coughing. Office Spirometry 07/13/17-moderate obstruction, moderate restriction of exhaled volume likely reflecting both air trapping and obesity hypoventilation.  FVC 2.40/51%, FEV1 1.83/53%, ratio 0.76, FEF 25-75%, 1.56/ 59%  09/01/2018- 68 year old male smoker followed for OSA, COPD,  complicated by tobacco use, obesity, DM 2, HBP, GERD CPAP auto 5-20/ [facility] Body weight today 331 lbs -----OSA on CPAP; DME: [facility], no issues w/ pressure settings or mask, recently got new supplies Download compliance 100%, AHI 1.0/hr Sleeps better with CPAP and nasal pillows. Admits he never tried Anoro sample, puts up with persistent wheeze. Agrees to try it now.   ROS-see HPI   + = positive Constitutional:    weight loss, night sweats, fevers, chills, fatigue, lassitude. HEENT:    headaches, difficulty swallowing, tooth/dental problems, sore throat,       sneezing, itching, ear ache, nasal  congestion, post nasal drip, snoring CV:    chest pain, orthopnea, PND, swelling in lower extremities, anasarca,                                                         dizziness, palpitations Resp:   +shortness of breath with exertion or at rest.                productive cough,   non-productive cough, coughing up of blood.              change in color of mucus.  +wheezing.   Skin:    rash or lesions. GI:  No-   heartburn, indigestion, abdominal pain, nausea, vomiting, diarrhea,                 change in bowel habits, loss of appetite GU: dysuria, change in color of urine, no urgency or frequency.   flank pain. MS:   joint pain, stiffness, decreased range of motion, back pain. Neuro-     nothing unusual Psych:  change in mood or affect.  depression or anxiety.   memory loss.      Objective:  OBJ- Physical Exam       General- Alert, Oriented, Affect-appropriate, Distress- none acute + Morbidly obese  Skin- rash-none, lesions- none, excoriation- none Lymphadenopathy- none Head- atraumatic            Eyes- Gross vision intact, PERRLA, conjunctivae and secretions clear  Ears- Hearing, canals-normal            Nose- Clear, no-Septal dev, mucus, polyps, erosion, perforation             Throat- Mallampati III , mucosa clear , drainage- none, tonsils- atrophic Neck- flexible , trachea midline, no stridor , thyroid nl, carotid no bruit Chest - symmetrical excursion , unlabored           Heart/CV- RRR , no murmur , no gallop  , no rub, nl s1 s2                           - JVD- none , edema- none, stasis changes- none, varices- none           Lung-  wheeze+ trace/ distant/unlabored, cough- none , dullness-none, rub- none           Chest wall-  Abd-  Br/ Gen/ Rectal- Not done, not indicated Extrem- cyanosis- none, clubbing, none, atrophy- none, strength- nl Neuro- grossly intact to observation  Assessment & Plan:

## 2018-09-01 NOTE — Patient Instructions (Signed)
Order- CXR   Dx  COPD mixed type  Sample Anoro inhaler     Try inhaling 1 puff, once daily    We can continue CPAP auto 5-20, mask of choice, humidifier, supplies, AirView  Please call if we can call

## 2018-09-06 ENCOUNTER — Telehealth: Payer: Self-pay | Admitting: Internal Medicine

## 2018-09-06 NOTE — Telephone Encounter (Signed)
Spoke with [place]. She stated that the patient was given a sample of Anoro last week and he was advised to drink water afterwards.   I advised her that normally we have patients to rinse their mouths out with water after using Anoro. Explained to her that is to prevent thrush.   She verbalized understanding and stating that she will relay the message to the patient since he was confused on why he needed to drink water after using the inhaler.   Nothing further needed at time of call.

## 2018-09-13 NOTE — Assessment & Plan Note (Signed)
He indicates no interest in life style change to accomplish weight loss.

## 2018-09-13 NOTE — Assessment & Plan Note (Signed)
He benefits from CPAP Plan- continue auto 5-20

## 2018-09-13 NOTE — Assessment & Plan Note (Signed)
He didn't bother to try Anoro last visit, but indicates willingness to try sample this time after discussion.

## 2018-09-13 NOTE — Assessment & Plan Note (Signed)
I have offered support and try to motivate him. Wife seems not to expoect improvement.

## 2018-10-27 ENCOUNTER — Ambulatory Visit: Payer: Self-pay | Admitting: Internal Medicine

## 2019-02-08 ENCOUNTER — Encounter: Payer: Self-pay | Admitting: Internal Medicine

## 2019-03-01 ENCOUNTER — Ambulatory Visit (INDEPENDENT_AMBULATORY_CARE_PROVIDER_SITE_OTHER): Payer: Medicare Other | Admitting: Internal Medicine

## 2019-03-01 ENCOUNTER — Other Ambulatory Visit: Payer: Self-pay

## 2019-03-01 ENCOUNTER — Encounter: Payer: Self-pay | Admitting: Internal Medicine

## 2019-03-01 DIAGNOSIS — J449 Chronic obstructive pulmonary disease, unspecified: Secondary | ICD-10-CM

## 2019-03-01 DIAGNOSIS — G4733 Obstructive sleep apnea (adult) (pediatric): Secondary | ICD-10-CM

## 2019-03-01 DIAGNOSIS — F17200 Nicotine dependence, unspecified, uncomplicated: Secondary | ICD-10-CM

## 2019-03-01 MED ORDER — TRELEGY ELLIPTA 100-62.5-25 MCG/INH IN AEPB: 1.0000 | INHALATION_SPRAY | Freq: Every day | RESPIRATORY_TRACT | 0 refills | Status: DC

## 2019-03-01 NOTE — Patient Instructions (Signed)
Order- DME [facility]- please replace old CPAP machine, auto 5-20, mask of choice, humidifier, supplies, AirView/ card  Sample Trelegy 100 or 200 inhaler as available--   Inhale 1 puff, then rinse mouth, once daily  Please stop smoking- you have to live in this body for the rest of your life !!  Please call if we can help

## 2019-03-01 NOTE — Progress Notes (Signed)
Subjective:    Patient ID: Andrew Mercer, male    DOB: Sep 28, 1950, 68 y.o.   MRN: [id number]  HPI   male smoker followed for OSA, COPD, complicated by tobacco use, Morbid obesity, DM 2, HBP, GERD NPSG 2004, AHI 9/ hr Office Spirometry 07/13/17-moderate obstruction, moderate restriction of exhaled volume likely reflecting both air trapping and obesity hypoventilation.  FVC 2.40/51%, FEV1 1.83/53%, ratio 0.76, FEF 25-75%, 1.56/ 59% ----------------------------------------------------------------------------   09/01/2018- 68 year old male smoker followed for OSA, COPD,  complicated by tobacco use, obesity, DM 2, HBP, GERD CPAP auto 5-20/ [facility] Body weight today 331 lbs -----OSA on CPAP; DME: [facility], no issues w/ pressure settings or mask, recently got new supplies Download compliance 100%, AHI 1.0/hr Sleeps better with CPAP and nasal pillows. Admits he never tried Anoro sample, puts up with persistent wheeze. Agrees to try it now.   03/01/2019- 68 year old male Smoker followed for OSA, COPD,  complicated by tobacco use, obesity, DM 2, HBP, GERD CPAP auto 5-20/ [facility] Download compliance 100%, AHI 0.4/ hr Body weight today 324 lbs     Had flu vax f/u OSA Not using his inhalers. Puts up with wheezing despite education again last visit. Continues to smoke against advice. Had flu vax. Little cough CXR 09/01/2018-  IMPRESSION: Bronchitic changes which could be acute or chronic.  ROS-see HPI   + = positive Constitutional:    weight loss, night sweats, fevers, chills, fatigue, lassitude. HEENT:    headaches, difficulty swallowing, tooth/dental problems, sore throat,       sneezing, itching, ear ache, nasal congestion, post nasal drip, snoring CV:    chest pain, orthopnea, PND, swelling in lower extremities, anasarca,                                                         dizziness, palpitations Resp:   +shortness of breath with exertion or at rest.                productive cough,    non-productive cough, coughing up of blood.              change in color of mucus.  +wheezing.   Skin:    rash or lesions. GI:  No-   heartburn, indigestion, abdominal pain, nausea, vomiting, diarrhea,                 change in bowel habits, loss of appetite GU: dysuria, change in color of urine, no urgency or frequency.   flank pain. MS:   joint pain, stiffness, decreased range of motion, back pain. Neuro-     nothing unusual Psych:  change in mood or affect.  depression or anxiety.   memory loss.      Objective:  OBJ- Physical Exam       General- Alert, Oriented, Affect-appropriate, Distress- none acute + Morbidly obese  Skin- rash-none, lesions- none, excoriation- none Lymphadenopathy- none Head- atraumatic            Eyes- Gross vision intact, PERRLA, conjunctivae and secretions clear            Ears- Hearing, canals-normal            Nose- Clear, no-Septal dev, mucus, polyps, erosion, perforation             Throat- Mallampati  III , mucosa clear , drainage- none, tonsils- atrophic Neck- flexible , trachea midline, no stridor , thyroid nl, carotid no bruit Chest - symmetrical excursion , unlabored           Heart/CV- RRR , no murmur , no gallop  , no rub, nl s1 s2                           - JVD- none , edema- none, stasis changes- none, varices- none           Lung-  wheeze+ trace/ distant/unlabored, cough- none , dullness-none, rub- none           Chest wall-  Abd-  Br/ Gen/ Rectal- Not done, not indicated Extrem- cyanosis- none, clubbing, none, atrophy- none, strength- nl Neuro- grossly intact to observation  Assessment & Plan:

## 2019-03-28 ENCOUNTER — Encounter: Payer: Self-pay | Admitting: Internal Medicine

## 2019-03-28 NOTE — Assessment & Plan Note (Addendum)
Benefits with good compliance and control Plan- continue auto 5-20. [facility] replace old machine and supplies

## 2019-03-28 NOTE — Assessment & Plan Note (Signed)
Continue to work on smoking cessation. Hope he will decide to try.

## 2019-03-28 NOTE — Assessment & Plan Note (Signed)
He has no interest in smoking cessation despite my counseling and offers of support. Wife seems to feel he won't quit.

## 2020-01-29 ENCOUNTER — Encounter: Payer: Self-pay | Admitting: Internal Medicine

## 2020-02-28 NOTE — Progress Notes (Signed)
Subjective:    Patient ID: Andrew Mercer, male    DOB: 02-10-1951, 69 y.o.   MRN: [id number]  HPI   male smoker followed for OSA, COPD, complicated by tobacco use, Morbid obesity, DM 2, HBP, GERD NPSG 2004, AHI 9/ hr Office Spirometry 07/13/17-moderate obstruction, moderate restriction of exhaled volume likely reflecting both air trapping and obesity hypoventilation.  FVC 2.40/51%, FEV1 1.83/53%, ratio 0.76, FEF 25-75%, 1.56/ 59% ---------------------------------------------------------------------------- .   03/01/2019- 69 year old male Smoker followed for OSA, COPD,  complicated by tobacco use, obesity, DM 2, HBP, GERD CPAP auto 5-20/ [facility] Download compliance 100%, AHI 0.4/ hr Body weight today 324 lbs     Had flu vax f/u OSA Not using his inhalers. Puts up with wheezing despite education again last visit. Continues to smoke against advice. Had flu vax. Little cough CXR 09/01/2018-  IMPRESSION: Bronchitic changes which could be acute or chronic.  02/29/20- 69 year old male Smoker followed for OSA, COPD,  complicated by tobacco use, obesity, DM 2, HBP, GERD  -Anoro,  CPAP auto 5-20/ [facility] Download- compliance 100%, AHI 1.3/ hr Body weight today- 324 lbs Covid vax- 3 Phizer Flu vax- Had Still smoking no interest in quitting                    Wife is here Comfortable with CPAP. Gets sent wrong supplies- discussed. Never tried the Anoro sample after last visit, or the Trelegy sample before that.  ROS-see HPI   + = positive Constitutional:    weight loss, night sweats, fevers, chills, fatigue, lassitude. HEENT:    headaches, difficulty swallowing, tooth/dental problems, sore throat,       sneezing, itching, ear ache, nasal congestion, post nasal drip, snoring CV:    chest pain, orthopnea, PND, swelling in lower extremities, anasarca,                                dizziness, palpitations Resp:   +shortness of breath with exertion or at rest.                productive  cough,   non-productive cough, coughing up of blood.              change in color of mucus.  +wheezing.   Skin:    rash or lesions. GI:  No-   heartburn, indigestion, abdominal pain, nausea, vomiting, diarrhea,                 change in bowel habits, loss of appetite GU: dysuria, change in color of urine, no urgency or frequency.   flank pain. MS:   joint pain, stiffness, decreased range of motion, back pain. Neuro-     nothing unusual Psych:  change in mood or affect.  depression or anxiety.   memory loss.      Objective:  OBJ- Physical Exam       General- Alert, Oriented, Affect-appropriate, Distress- none acute + Morbidly obese  Skin- rash-none, lesions- none, excoriation- none Lymphadenopathy- none Head- atraumatic            Eyes- Gross vision intact, PERRLA, conjunctivae and secretions clear            Ears- Hearing, canals-normal            Nose- Clear, no-Septal dev, mucus, polyps, erosion, perforation             Throat- Mallampati III , mucosa  clear , drainage- none, tonsils- atrophic Neck- flexible , trachea midline, no stridor , thyroid nl, carotid no bruit Chest - symmetrical excursion , unlabored           Heart/CV- RRR , no murmur , no gallop  , no rub, nl s1 s2                           - JVD- none , edema- none, stasis changes- none, varices- none           Lung-  wheeze+unlabored, cough- none , dullness-none, rub- none           Chest wall-  Abd-  Br/ Gen/ Rectal- Not done, not indicated Extrem- cyanosis- none, clubbing, none, atrophy- none, strength- nl Neuro- grossly intact to observation  Assessment & Plan:

## 2020-02-29 ENCOUNTER — Ambulatory Visit (INDEPENDENT_AMBULATORY_CARE_PROVIDER_SITE_OTHER): Payer: Medicare Other

## 2020-02-29 ENCOUNTER — Other Ambulatory Visit: Payer: Self-pay

## 2020-02-29 ENCOUNTER — Ambulatory Visit (INDEPENDENT_AMBULATORY_CARE_PROVIDER_SITE_OTHER): Payer: Medicare Other | Admitting: Internal Medicine

## 2020-02-29 ENCOUNTER — Encounter: Payer: Self-pay | Admitting: Internal Medicine

## 2020-02-29 VITALS — BP 128/80 | HR 84 | Temp 97.4°F | Ht 71.0 in | Wt 324.6 lb

## 2020-02-29 DIAGNOSIS — G4733 Obstructive sleep apnea (adult) (pediatric): Secondary | ICD-10-CM | POA: Diagnosis not present

## 2020-02-29 DIAGNOSIS — F17200 Nicotine dependence, unspecified, uncomplicated: Secondary | ICD-10-CM

## 2020-02-29 DIAGNOSIS — Z720 Tobacco use: Secondary | ICD-10-CM

## 2020-02-29 DIAGNOSIS — J449 Chronic obstructive pulmonary disease, unspecified: Secondary | ICD-10-CM | POA: Diagnosis not present

## 2020-02-29 MED ORDER — UMECLIDINIUM-VILANTEROL 62.5-25 MCG/INH IN AEPB: 1.0000 | INHALATION_SPRAY | Freq: Every day | RESPIRATORY_TRACT | 0 refills | Status: DC

## 2020-02-29 NOTE — Assessment & Plan Note (Signed)
No effort to significantly change lifestyle towards goal of weight loss.

## 2020-02-29 NOTE — Assessment & Plan Note (Signed)
He won't stop smoking and hasn't even tried sample inhalers for overt wheezing. He does agree to try Anoro inhaler this time, so we are giving him another.

## 2020-02-29 NOTE — Assessment & Plan Note (Signed)
Seems fatalistic about his smoking. Cessation effort discussed and encouraged.

## 2020-02-29 NOTE — Assessment & Plan Note (Signed)
Benefits from CPAP with good compliance and control Plan- continue CPAP auto 5-20 

## 2020-02-29 NOTE — Patient Instructions (Signed)
Order- CXR   Dx tobacco user  Sample Anoro inhaler    Inhale 1 puff, once daily  See if this helps your breathing  Please call if we can help  Please set your mind to stopping smoking. If you can try, we can try to help.

## 2020-03-01 ENCOUNTER — Other Ambulatory Visit: Payer: Self-pay

## 2020-03-01 DIAGNOSIS — J849 Interstitial pulmonary disease, unspecified: Secondary | ICD-10-CM

## 2020-03-15 ENCOUNTER — Ambulatory Visit
Admission: RE | Admit: 2020-03-15 | Discharge: 2020-03-15 | Disposition: A | Discharge status: Home / Self Care | Payer: Medicare Other | Source: Ambulatory Visit | Attending: Internal Medicine | Admitting: Internal Medicine

## 2020-03-15 ENCOUNTER — Other Ambulatory Visit: Payer: Self-pay

## 2020-03-15 DIAGNOSIS — J849 Interstitial pulmonary disease, unspecified: Secondary | ICD-10-CM

## 2020-03-18 ENCOUNTER — Telehealth: Payer: Self-pay | Admitting: Internal Medicine

## 2020-03-18 NOTE — Telephone Encounter (Signed)
Called and spoke with [place], pts wife and she is aware that the CT scan has been sent over to the pts PCP.  Nothing further is needed.

## 2021-02-27 NOTE — Progress Notes (Signed)
Subjective:    Patient ID: Andrew Mercer, male    DOB: [birth year], 70 y.o.   MRN: [id number]  HPI   male smoker followed for OSA, COPD, complicated by tobacco use, Morbid obesity, DM 2, HBP, GERD NPSG 2004, AHI 9/ hr Office Spirometry 07/13/17-moderate obstruction, moderate restriction of exhaled volume likely reflecting both air trapping and obesity hypoventilation.  FVC 2.40/51%, FEV1 1.83/53%, ratio 0.76, FEF 25-75%, 1.56/ 59% ---------------------------------------------------------------------------- .  02/29/20- 70 year old male Smoker followed for OSA, COPD,  complicated by tobacco use, Obesity, DM 2, HTN, GERD,   -Anoro,  CPAP auto 5-20/ [facility] Download- compliance 100%, AHI 1.3/ hr Body weight today- 324 lbs Covid vax- 3 Phizer Flu vax- Had Still smoking no interest in quitting                    Wife is here Comfortable with CPAP. Gets sent wrong supplies- discussed. Never tried the Anoro sample after last visit, or the Trelegy sample before that.  02/28/21- 70 year old male Smoker ( 2 ppd/ 38 pkyrs) followed for OSA, COPD,  complicated by tobacco use, obesity, DM 2, HBP, GERD, CAD/ ASCVD,  -Anoro or Trelegy ? CPAP auto 4-20/  [facility] Download- compliance 100%, AHI 0.7/ hr             wife is here Body weight today- 328 lbs Covid vax- 4 Phizer Flu vax- had He continues smoking 2 packs/day with no interest in quitting.  Admits dyspnea on exertion, unchanged.  Chronic cough he reports unchanged, only white sputum.  Denies chest pain or palpitation. HRCT chest- 12.4.21- IMPRESSION: 1. No definitive imaging findings to suggest interstitial lung disease. 2. 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 3. Aortic atherosclerosis, in addition to left main and 3 vessel coronary artery disease. Please note that although the presence of coronary artery calcium documents the presence of coronary artery disease, the severity of this disease and any potential stenosis cannot  be assessed on this non-gated CT examination. Assessment for potential risk factor modification, dietary therapy or pharmacologic therapy may be warranted, if clinically indicated. 4. Cholelithiasis. 5. Hepatic steatosis with evidence of hepatic cirrhosis.  Aortic Atherosclerosis (ICD10-I70.0).   ROS-see HPI   + = positive Constitutional:    weight loss, night sweats, fevers, chills, fatigue, lassitude. HEENT:    headaches, difficulty swallowing, tooth/dental problems, sore throat,       sneezing, itching, ear ache, nasal congestion, post nasal drip, snoring CV:    chest pain, orthopnea, PND, swelling in lower extremities, anasarca,                                 dizziness, palpitations Resp:   +shortness of breath with exertion or at rest.                productive cough,   non-productive cough, coughing up of blood.              change in color of mucus.  +wheezing.   Skin:    rash or lesions. GI:  No-   heartburn, indigestion, abdominal pain, nausea, vomiting, diarrhea,                 change in bowel habits, loss of appetite GU: dysuria, change in color of urine, no urgency or frequency.   flank pain. MS:   joint pain, stiffness, decreased range of motion, back pain. Neuro-  nothing unusual Psych:  change in mood or affect.  depression or anxiety.   memory loss.      Objective:  OBJ- Physical Exam       General- Alert, Oriented, Affect-appropriate, Distress- none acute + Morbidly obese  Skin- rash-none, lesions- none, excoriation- none     Mustache is tobacco stained Lymphadenopathy- none Head- atraumatic            Eyes- Gross vision intact, PERRLA, conjunctivae and secretions clear            Ears- Hearing, canals-normal            Nose- Clear, no-Septal dev, mucus, polyps, erosion, perforation             Throat- Mallampati III , mucosa clear , drainage- none, tonsils- atrophic Neck- flexible , trachea midline, no stridor , thyroid nl, carotid no bruit Chest -  symmetrical excursion , unlabored           Heart/CV- RRR , no murmur , no gallop  , no rub, nl s1 s2                           - JVD- none , edema- none, stasis changes- none, varices- none           Lung-  wheeze+unlabored/ bilateral, cough- none , dullness-none, rub- none           Chest wall-  Abd-  Br/ Gen/ Rectal- Not done, not indicated Extrem- cyanosis- none, clubbing, none, atrophy- none, strength- nl Neuro- grossly intact to observation  Assessment & Plan:

## 2021-02-28 ENCOUNTER — Ambulatory Visit (INDEPENDENT_AMBULATORY_CARE_PROVIDER_SITE_OTHER): Payer: Medicare Other | Admitting: Internal Medicine

## 2021-02-28 ENCOUNTER — Ambulatory Visit (INDEPENDENT_AMBULATORY_CARE_PROVIDER_SITE_OTHER): Payer: Medicare Other

## 2021-02-28 ENCOUNTER — Encounter: Payer: Self-pay | Admitting: Internal Medicine

## 2021-02-28 ENCOUNTER — Telehealth: Payer: Self-pay | Admitting: Internal Medicine

## 2021-02-28 ENCOUNTER — Other Ambulatory Visit: Payer: Self-pay

## 2021-02-28 VITALS — BP 132/74 | HR 79 | Temp 98.0°F | Ht 71.0 in | Wt 328.2 lb

## 2021-02-28 DIAGNOSIS — J449 Chronic obstructive pulmonary disease, unspecified: Secondary | ICD-10-CM | POA: Diagnosis not present

## 2021-02-28 DIAGNOSIS — G4733 Obstructive sleep apnea (adult) (pediatric): Secondary | ICD-10-CM

## 2021-02-28 DIAGNOSIS — F17200 Nicotine dependence, unspecified, uncomplicated: Secondary | ICD-10-CM

## 2021-02-28 MED ORDER — BREZTRI AEROSPHERE 160-9-4.8 MCG/ACT IN AERO: 2.0000 | INHALATION_SPRAY | Freq: Two times a day (BID) | RESPIRATORY_TRACT | 0 refills | Status: DC

## 2021-02-28 NOTE — Telephone Encounter (Signed)
Order- send name to pharmacy smoking team

## 2021-02-28 NOTE — Patient Instructions (Signed)
Order- CXR   dx COPD mixed type  Order- schedule PFT    dx COPD mixed type  Order- sample Breztri inhaler    inhale 2 puffs, then rinse mouth, twice daily Try to use up this sample and see if you notice any improvement in wheezing, cough, shortness of breath.  Order- send name to pharmacy smoking team

## 2021-03-03 ENCOUNTER — Other Ambulatory Visit: Payer: Self-pay

## 2021-03-03 ENCOUNTER — Encounter: Payer: Self-pay | Admitting: Internal Medicine

## 2021-03-03 MED ORDER — AMOXICILLIN-POT CLAVULANATE 875-125 MG PO TABS: 1.0000 | ORAL_TABLET | Freq: Two times a day (BID) | ORAL | 0 refills | Status: DC

## 2021-03-03 NOTE — Assessment & Plan Note (Signed)
Wheezing today but denies any acute process.  We will try for better control. Plan-CXR, sample [employer]

## 2021-03-03 NOTE — Assessment & Plan Note (Signed)
He seems completely uninterested in smoking cessation.  He does agree to let the pharmacy smoking cessation program contact him.

## 2021-03-03 NOTE — Assessment & Plan Note (Signed)
Benefits from CPAP with good compliance and control.  No concerns.

## 2021-03-28 ENCOUNTER — Telehealth: Payer: Self-pay

## 2021-03-28 NOTE — Telephone Encounter (Signed)
° °  Gabriel Conry Leone is a 70 y.o. male and has been referred to the pharmacist telephone-based smoking cessation service by pulmonologist Dr. Maple Hudson  -------------------------------------------------------------------------------------------------------------------  Confirmed that patient does not meet any of the following exclusion criteria: No  Pregnancy Schizophrenia, bipolar disorder, or major depression Myocardial infarction or coronary artery bypass grafting in the last 2 months Severe or worsening angina  Currently smoking > 10 cigarettes per day? Yes  Willing to quit smoking now or within 30 days? No  Interested in using medications to help you quit smoking? No   If no, patient does not meet criteria for study. Patient was referred back to pulmonologist and/or PCP, recommend [facility] at 1-800-QUITNOW for additional support.  -------------------------------------------------------------------------------------------------------------------  Patient reports that he is not willing to quit smoking. He emphasized that he endorsed at his last visit "I've been smoking for 40 years, I don't see the point of quitting now". Explained to patient that no matter how long he has been smoking, there is still benefit to quitting now. Provided patient with my contact information and informed him to call me if he changes his mind.   Valeda Malm, Pharm.D. PGY-1 Pharmacy Resident 03/28/2021 10:40 AM

## 2021-04-04 ENCOUNTER — Ambulatory Visit (INDEPENDENT_AMBULATORY_CARE_PROVIDER_SITE_OTHER): Payer: Medicare Other | Admitting: Internal Medicine

## 2021-04-04 ENCOUNTER — Other Ambulatory Visit: Payer: Self-pay

## 2021-04-04 DIAGNOSIS — J449 Chronic obstructive pulmonary disease, unspecified: Secondary | ICD-10-CM

## 2021-04-04 LAB — PULMONARY FUNCTION TEST
DL/VA % pred: 103 %
DL/VA: 4.17 ml/min/mmHg/L
DLCO cor % pred: 85 %
DLCO cor: 21.3 ml/min/mmHg
DLCO unc % pred: 85 %
DLCO unc: 21.3 ml/min/mmHg
FEF 25-75 Post: 1.67 L/sec
FEF 25-75 Pre: 1.48 L/sec
FEF2575-%Change-Post: 13 %
FEF2575-%Pred-Post: 71 %
FEF2575-%Pred-Pre: 63 %
FEV1-%Change-Post: 2 %
FEV1-%Pred-Post: 65 %
FEV1-%Pred-Pre: 63 %
FEV1-Post: 2.03 L
FEV1-Pre: 1.97 L
FEV1FVC-%Change-Post: 3 %
FEV1FVC-%Pred-Pre: 101 %
FEV6-%Change-Post: 0 %
FEV6-%Pred-Post: 66 %
FEV6-%Pred-Pre: 66 %
FEV6-Post: 2.64 L
FEV6-Pre: 2.64 L
FEV6FVC-%Pred-Post: 106 %
FEV6FVC-%Pred-Pre: 106 %
FVC-%Change-Post: 0 %
FVC-%Pred-Post: 62 %
FVC-%Pred-Pre: 62 %
FVC-Post: 2.64 L
FVC-Pre: 2.64 L
Post FEV1/FVC ratio: 77 %
Post FEV6/FVC ratio: 100 %
Pre FEV1/FVC ratio: 74 %
Pre FEV6/FVC Ratio: 100 %
RV % pred: 111 %
RV: 2.68 L
TLC % pred: 81 %
TLC: 5.57 L

## 2021-04-04 NOTE — Progress Notes (Signed)
PFT done today. 

## 2021-08-27 NOTE — Progress Notes (Signed)
Subjective:    Patient ID: Andrew Mercer, male    DOB: 12/08/1950, 71 y.o.   MRN: [id number]  HPI   male smoker followed for OSA, COPD, complicated by tobacco use, Morbid obesity, DM 2, HBP, GERD NPSG 2004, AHI 9/ hr Office Spirometry 07/13/17-moderate obstruction, moderate restriction of exhaled volume likely reflecting both air trapping and obesity hypoventilation.  FVC 2.40/51%, FEV1 1.83/53%, ratio 0.76, FEF 25-75%, 1.56/ 59% PFT 04/04/21- moderate obstruction with no resp to BD. ----------------------------------------------------------------------------   02/28/21- 71 year old male Smoker ( 2 ppd/ 27 pkyrs) followed for OSA, COPD,  complicated by tobacco use, obesity, DM 2, HBP, GERD, CAD/ ASCVD,  -Anoro or Trelegy ? CPAP auto 4-20/  Adapt Download- compliance 100%, AHI 0.7/ hr             wife is here Body weight today- 328 lbs Covid vax- 4 [employer] Flu vax- had He continues smoking 2 packs/day with no interest in quitting.  Admits dyspnea on exertion, unchanged.  Chronic cough he reports unchanged, only white sputum.  Denies chest pain or palpitation. HRCT chest- 12.4.21- IMPRESSION: 1. No definitive imaging findings to suggest interstitial lung disease. 2. 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 3. Aortic atherosclerosis, in addition to left main and 3 vessel coronary artery disease. Please note that although the presence of coronary artery calcium documents the presence of coronary artery disease, the severity of this disease and any potential stenosis cannot be assessed on this non-gated CT examination. Assessment for potential risk factor modification, dietary therapy or pharmacologic therapy may be warranted, if clinically indicated. 4. Cholelithiasis. 5. Hepatic steatosis with evidence of hepatic cirrhosis.  Aortic Atherosclerosis (ICD10-I70.0).  08/28/21- 71 year old male Smoker ( 2 ppd/ 51 pkyrs) followed for OSA, COPD,  complicated by tobacco use,  obesity, DM 2, HBP, GERD, CAD/ ASCVD,  -Breztri,  CPAP auto 4-20/  Adapt Download- compliance   100%, AHI 1.3/ hr        Body weight today- 326 lbs Covid vax- 5 Phizer Flu vax- had Pt declined smoking cessation help from Pharmacy team. PFT 04/04/21- moderate obstruction with no resp to BD. -----Patient is doing good, no concerns We discussed his PFT.  Both he and I actually expected him to score worse than he did.  He had rejected out reach from pharmacy smoking cessation team saying that he had smoked for 40 years and did not plan to quit.  He again says that he never tried the sample inhaler we gave him last visit and indicated he was considering it.  We discussed Breztri. He minimizes cough. CXR 02/28/21-  IMPRESSION: New right middle lobe opacification worrisome for pneumonia  ROS-see HPI   + = positive Constitutional:    weight loss, night sweats, fevers, chills, fatigue, lassitude. HEENT:    headaches, difficulty swallowing, tooth/dental problems, sore throat,       sneezing, itching, ear ache, nasal congestion, post nasal drip, snoring CV:    chest pain, orthopnea, PND, swelling in lower extremities, anasarca,                                 dizziness, palpitations Resp:   +shortness of breath with exertion or at rest.                productive cough,   non-productive cough, coughing up of blood.              change  in color of mucus.  +wheezing.   Skin:    rash or lesions. GI:  No-   heartburn, indigestion, abdominal pain, nausea, vomiting, diarrhea,                 change in bowel habits, loss of appetite GU: dysuria, change in color of urine, no urgency or frequency.   flank pain. MS:   joint pain, stiffness, decreased range of motion, back pain. Neuro-     nothing unusual Psych:  change in mood or affect.  depression or anxiety.   memory loss.      Objective:  OBJ- Physical Exam       General- Alert, Oriented, Affect-appropriate, Distress- none acute + Morbidly obese   Skin- rash-none, lesions- none, excoriation- none     Mustache is tobacco stained Lymphadenopathy- none Head- atraumatic            Eyes- Gross vision intact, PERRLA, conjunctivae and secretions clear            Ears- Hearing, canals-normal            Nose- Clear, no-Septal dev, mucus, polyps, erosion, perforation             Throat- Mallampati III , mucosa clear , drainage- none, tonsils- atrophic Neck- flexible , trachea midline, no stridor , thyroid nl, carotid no bruit Chest - symmetrical excursion , unlabored           Heart/CV- RRR , no murmur , no gallop  , no rub, nl s1 s2                           - JVD- none , edema- none, stasis changes- none, varices- none           Lung-  wheeze+unlabored/ bilateral, cough- none , dullness-none, rub- none           Chest wall-  Abd-  Br/ Gen/ Rectal- Not done, not indicated Extrem- cyanosis- none, clubbing, none, atrophy- none, strength- nl Neuro- grossly intact to observation  Assessment & Plan:

## 2021-08-29 ENCOUNTER — Ambulatory Visit: Payer: Medicare Other

## 2021-08-29 ENCOUNTER — Encounter: Payer: Self-pay | Admitting: Internal Medicine

## 2021-08-29 ENCOUNTER — Ambulatory Visit (INDEPENDENT_AMBULATORY_CARE_PROVIDER_SITE_OTHER): Payer: Medicare Other | Admitting: Internal Medicine

## 2021-08-29 VITALS — BP 122/70 | HR 67 | Temp 98.3°F | Ht 71.0 in | Wt 326.4 lb

## 2021-08-29 DIAGNOSIS — G4733 Obstructive sleep apnea (adult) (pediatric): Secondary | ICD-10-CM | POA: Diagnosis not present

## 2021-08-29 DIAGNOSIS — F17200 Nicotine dependence, unspecified, uncomplicated: Secondary | ICD-10-CM | POA: Diagnosis not present

## 2021-08-29 DIAGNOSIS — R9389 Abnormal findings on diagnostic imaging of other specified body structures: Secondary | ICD-10-CM | POA: Diagnosis not present

## 2021-08-29 DIAGNOSIS — J449 Chronic obstructive pulmonary disease, unspecified: Secondary | ICD-10-CM

## 2021-08-29 NOTE — Assessment & Plan Note (Signed)
He has actively refused out reach efforts to help him with smoking cessation.

## 2021-08-29 NOTE — Patient Instructions (Signed)
Why don't you go ahead and try the Breztri inhaler- inhale 2 puffs then rinse your mouth, twice daily. See if it helps your breathing.  Keep on with CPAP   Please call if we can help

## 2021-08-29 NOTE — Assessment & Plan Note (Signed)
Moderately severe COPD with ongoing 2 pack/day smoking habit and he has not bothered to even try sample inhalers we have given.  I will keep trying to help him.

## 2021-08-29 NOTE — Assessment & Plan Note (Signed)
He benefits from CPAP with good compliance and control.  It is interesting that he he is so diligent with this, when he is so nonchalant about everything else. Plan-continue auto 4-20

## 2021-08-29 NOTE — Assessment & Plan Note (Signed)
CXR in November question pneumonia with right middle lobe opacification.  He denies change in cough and does not have focal signs localizing to that area. Plan-update CXR

## 2021-09-01 ENCOUNTER — Ambulatory Visit (HOSPITAL_COMMUNITY)
Admission: RE | Admit: 2021-09-01 | Discharge: 2021-09-01 | Disposition: A | Discharge status: Home / Self Care | Payer: Medicare Other | Source: Ambulatory Visit | Attending: Internal Medicine | Admitting: Internal Medicine

## 2021-09-01 DIAGNOSIS — R9389 Abnormal findings on diagnostic imaging of other specified body structures: Secondary | ICD-10-CM | POA: Insufficient documentation

## 2021-09-01 DIAGNOSIS — F17200 Nicotine dependence, unspecified, uncomplicated: Secondary | ICD-10-CM | POA: Insufficient documentation

## 2021-09-09 ENCOUNTER — Telehealth: Payer: Self-pay | Admitting: Internal Medicine

## 2021-09-09 NOTE — Telephone Encounter (Signed)
Spoke with the pt's spouse, ok per DPR and notified of results:      Andrew Budge, MD  09/03/2021  4:15 PM EDT     CXR- clearing of pneumonia in right lung   Nothing further needed.

## 2022-08-29 NOTE — Progress Notes (Unsigned)
Subjective:    Patient ID: Andrew Mercer, male    DOB: 02-26-1951, 72 y.o.   MRN: [id number]  HPI   male smoker followed for OSA, COPD, complicated by tobacco use, Morbid obesity, DM 2, HBP, GERD NPSG 2004, AHI 9/ hr Office Spirometry 07/13/17-moderate obstruction, moderate restriction of exhaled volume likely reflecting both air trapping and obesity hypoventilation.  FVC 2.40/51%, FEV1 1.83/53%, ratio 0.76, FEF 25-75%, 1.56/ 59% PFT 04/04/21- moderate obstruction with no resp to BD. ----------------------------------------------------------------------------   08/28/21- 72 year old male Smoker ( 2 ppd/ 3 pkyrs) followed for OSA, COPD,  complicated by tobacco use, obesity, DM 2, HBP, GERD, CAD/ ASCVD,  -Breztri,  CPAP auto 4-20/  Adapt Download- compliance   100%, AHI 1.3/ hr        Body weight today- 326 lbs Covid vax- 5 Phizer Flu vax- had Pt declined smoking cessation help from Pharmacy team. PFT 04/04/21- moderate obstruction with no resp to BD. -----Patient is doing good, no concerns We discussed his PFT.  Both he and I actually expected him to score worse than he did.  He had rejected out reach from pharmacy smoking cessation team saying that he had smoked for 40 years and did not plan to quit.  He again says that he never tried the sample inhaler we gave him last visit and indicated he was considering it.  We discussed Breztri. He minimizes cough. CXR 02/28/21-  IMPRESSION: New right middle lobe opacification worrisome for pneumonia  08/31/22-  72 year old male Smoker ( 2 ppd/ 27 pkyrs) followed for OSA, COPD,  complicated by tobacco use, obesity, DM 2, HBP, GERD, CAD/ ASCVD, Peripheral Edema,   -Breztri,  CPAP auto 4-20/  Adapt Download- compliance  100%, AHI 1.6/ hr       Body weight today- 325 lbs Download reviewed.  He says CPAP works very well with no concerns. He does admit increased shortness of breath with cough and wheeze.  He continues to smoke without effort to  stop.  He never tried the [employer] we gave him previously but is open now to trying an inhaler.  We will let him try Trelegy. CXR 09/01/21- MPRESSION: There is interval improvement in aeration of right lower lung fields, possibly suggesting resolution of pneumonia. There are no signs of pulmonary edema or focal pulmonary consolidation in the current study.  ROS-see HPI   + = positive Constitutional:    weight loss, night sweats, fevers, chills, fatigue, lassitude. HEENT:    headaches, difficulty swallowing, tooth/dental problems, sore throat,       sneezing, itching, ear ache, nasal congestion, post nasal drip, snoring CV:    chest pain, orthopnea, PND, swelling in lower extremities, anasarca,                                 dizziness, palpitations Resp:   +shortness of breath with exertion or at rest.                productive cough,   non-productive cough, coughing up of blood.              change in color of mucus.  +wheezing.   Skin:    rash or lesions. GI:  No-   heartburn, indigestion, abdominal pain, nausea, vomiting, diarrhea,                 change in bowel habits, loss of appetite GU: dysuria, change  in color of urine, no urgency or frequency.   flank pain. MS:   joint pain, stiffness, decreased range of motion, back pain. Neuro-     nothing unusual Psych:  change in mood or affect.  depression or anxiety.   memory loss.      Objective:  OBJ- Physical Exam       General- Alert, Oriented, Affect-appropriate, Distress- none acute + Morbidly obese  Skin- rash-none, lesions- none, excoriation- none     Mustache is tobacco stained Lymphadenopathy- none Head- atraumatic            Eyes- Gross vision intact, PERRLA, conjunctivae and secretions clear            Ears- Hearing, canals-normal            Nose- Clear, no-Septal dev, mucus, polyps, erosion, perforation             Throat- Mallampati III , mucosa clear , drainage- none, tonsils- atrophic Neck- flexible , trachea  midline, no stridor , thyroid nl, carotid no bruit Chest - symmetrical excursion , unlabored           Heart/CV- RRR , no murmur , no gallop  , no rub, nl s1 s2                           - JVD- none , edema- none, stasis changes- none, varices- none           Lung-  wheeze+unlabored/ bilateral, cough- none , dullness-none, rub- none           Chest wall-  Abd-  Br/ Gen/ Rectal- Not done, not indicated Extrem- cyanosis- none, clubbing, none, atrophy- none, strength- nl Neuro- grossly intact to observation  Assessment & Plan:

## 2022-08-31 ENCOUNTER — Encounter: Payer: Self-pay | Admitting: Internal Medicine

## 2022-08-31 ENCOUNTER — Ambulatory Visit (INDEPENDENT_AMBULATORY_CARE_PROVIDER_SITE_OTHER): Payer: Medicare Other | Admitting: Internal Medicine

## 2022-08-31 ENCOUNTER — Ambulatory Visit (INDEPENDENT_AMBULATORY_CARE_PROVIDER_SITE_OTHER): Payer: Medicare Other

## 2022-08-31 VITALS — BP 130/64 | HR 78 | Temp 98.4°F | Ht 71.0 in | Wt 325.8 lb

## 2022-08-31 DIAGNOSIS — J449 Chronic obstructive pulmonary disease, unspecified: Secondary | ICD-10-CM | POA: Diagnosis not present

## 2022-08-31 DIAGNOSIS — G4733 Obstructive sleep apnea (adult) (pediatric): Secondary | ICD-10-CM

## 2022-08-31 DIAGNOSIS — F17200 Nicotine dependence, unspecified, uncomplicated: Secondary | ICD-10-CM

## 2022-08-31 MED ORDER — TRELEGY ELLIPTA 100-62.5-25 MCG/ACT IN AEPB: 1.0000 | INHALATION_SPRAY | Freq: Every day | RESPIRATORY_TRACT | 0 refills | Status: DC

## 2022-08-31 NOTE — Assessment & Plan Note (Signed)
Dyspnea is probably multifactorial, including COPD and obesity with uncertain cardiovascular component. Plan-sample Trelegy 100 with discussion.  Update CXR.

## 2022-08-31 NOTE — Assessment & Plan Note (Signed)
Hope to get him motivated to make some effort at diet and exercise.

## 2022-08-31 NOTE — Patient Instructions (Signed)
Order- sample Trelegy 100     inhale 1 puff, then rinse mouth or eat a meal right after using, once daily   Order- CXR   dx COPD mixed type  We can continue CPAP auto 4-20

## 2022-08-31 NOTE — Assessment & Plan Note (Signed)
He benefits from CPAP with good compliance and control Plan-continue auto 4-20

## 2022-08-31 NOTE — Assessment & Plan Note (Signed)
Emphasis again on smoking cessation with support available.

## 2022-10-01 ENCOUNTER — Other Ambulatory Visit (HOSPITAL_COMMUNITY): Payer: Self-pay | Admitting: Gastroenterology

## 2022-10-01 DIAGNOSIS — R1032 Left lower quadrant pain: Secondary | ICD-10-CM

## 2022-10-22 ENCOUNTER — Telehealth: Payer: Self-pay | Admitting: Internal Medicine

## 2022-10-22 NOTE — Telephone Encounter (Signed)
Pt wife calling in to see if we can provide pt with a sample of Trelegy. Pt is scheduled to see Dr. Maple Hudson on 07/22

## 2022-10-23 ENCOUNTER — Ambulatory Visit (HOSPITAL_BASED_OUTPATIENT_CLINIC_OR_DEPARTMENT_OTHER)
Admission: RE | Admit: 2022-10-23 | Discharge: 2022-10-23 | Disposition: A | Discharge status: Home / Self Care | Payer: Medicare Other | Source: Ambulatory Visit | Attending: Gastroenterology | Admitting: Gastroenterology

## 2022-10-23 DIAGNOSIS — R1032 Left lower quadrant pain: Secondary | ICD-10-CM | POA: Insufficient documentation

## 2022-10-23 LAB — POCT I-STAT CREATININE: Creatinine, Ser: 1.3 mg/dL — ABNORMAL HIGH (ref 0.61–1.24)

## 2022-10-23 MED ORDER — IOHEXOL 300 MG/ML  SOLN
100.0000 mL | Freq: Once | INTRAMUSCULAR | Status: AC | PRN
  Administered 2022-10-23: 100 mL via INTRAVENOUS

## 2022-10-23 NOTE — Telephone Encounter (Signed)
She is calling again for the sample to be left. Going out of town today. Please call to advise. [phone]

## 2022-10-30 NOTE — Progress Notes (Unsigned)
Subjective:    Patient ID: Andrew Mercer, male    DOB: [birth year], 72 y.o.   MRN: [id number]  HPI   male smoker followed for OSA, COPD, complicated by tobacco use, Morbid obesity, DM 2, HBP, GERD NPSG 2004, AHI 9/ hr Office Spirometry 07/13/17-moderate obstruction, moderate restriction of exhaled volume likely reflecting both air trapping and obesity hypoventilation.  FVC 2.40/51%, FEV1 1.83/53%, ratio 0.76, FEF 25-75%, 1.56/ 59% PFT 04/04/21- moderate obstruction with no resp to BD. ----------------------------------------------------------------------------  08/31/22-  72 year old male Smoker ( 2 ppd/ 97 pkyrs) followed for OSA, COPD,  complicated by tobacco use, obesity, DM 2, HBP, GERD, CAD/ ASCVD, Peripheral Edema,   -Breztri,  CPAP auto 4-20/  Adapt Download- compliance  100%, AHI 1.6/ hr       Body weight today- 325 lbs Download reviewed.  He says CPAP works very well with no concerns. He does admit increased shortness of breath with cough and wheeze.  He continues to smoke without effort to stop.  He never tried the [employer] we gave him previously but is open now to trying an inhaler.  We will let him try Trelegy. CXR 09/01/21- MPRESSION: There is interval improvement in aeration of right lower lung fields, possibly suggesting resolution of pneumonia. There are no signs of pulmonary edema or focal pulmonary consolidation in the current study.  11/02/22- 72 year old male Smoker ( 2 ppd/ 15 pkyrs) followed for OSA, COPD,  complicated by tobacco use, obesity, DM 2, HBP, GERD, CAD/ ASCVD, Peripheral Edema,   -Breztri or Trelegy 100  CPAP auto 4-20/  Adapt Download- compliance        Body weight today-   CXR 08/31/22-  IMPRESSION: No acute findings.  ROS-see HPI   + = positive Constitutional:    weight loss, night sweats, fevers, chills, fatigue, lassitude. HEENT:    headaches, difficulty swallowing, tooth/dental problems, sore throat,       sneezing, itching, ear ache,  nasal congestion, post nasal drip, snoring CV:    chest pain, orthopnea, PND, swelling in lower extremities, anasarca,                                 dizziness, palpitations Resp:   +shortness of breath with exertion or at rest.                productive cough,   non-productive cough, coughing up of blood.              change in color of mucus.  +wheezing.   Skin:    rash or lesions. GI:  No-   heartburn, indigestion, abdominal pain, nausea, vomiting, diarrhea,                 change in bowel habits, loss of appetite GU: dysuria, change in color of urine, no urgency or frequency.   flank pain. MS:   joint pain, stiffness, decreased range of motion, back pain. Neuro-     nothing unusual Psych:  change in mood or affect.  depression or anxiety.   memory loss.      Objective:  OBJ- Physical Exam       General- Alert, Oriented, Affect-appropriate, Distress- none acute + Morbidly obese  Skin- rash-none, lesions- none, excoriation- none     Mustache is tobacco stained Lymphadenopathy- none Head- atraumatic            Eyes- Gross vision intact, PERRLA, conjunctivae  and secretions clear            Ears- Hearing, canals-normal            Nose- Clear, no-Septal dev, mucus, polyps, erosion, perforation             Throat- Mallampati III , mucosa clear , drainage- none, tonsils- atrophic Neck- flexible , trachea midline, no stridor , thyroid nl, carotid no bruit Chest - symmetrical excursion , unlabored           Heart/CV- RRR , no murmur , no gallop  , no rub, nl s1 s2                           - JVD- none , edema- none, stasis changes- none, varices- none           Lung-  wheeze+unlabored/ bilateral, cough- none , dullness-none, rub- none           Chest wall-  Abd-  Br/ Gen/ Rectal- Not done, not indicated Extrem- cyanosis- none, clubbing, none, atrophy- none, strength- nl Neuro- grossly intact to observation  Assessment & Plan:

## 2022-11-02 ENCOUNTER — Ambulatory Visit: Payer: Medicare Other

## 2022-11-02 ENCOUNTER — Encounter: Payer: Self-pay | Admitting: Internal Medicine

## 2022-11-02 ENCOUNTER — Ambulatory Visit (INDEPENDENT_AMBULATORY_CARE_PROVIDER_SITE_OTHER): Payer: Medicare Other | Admitting: Internal Medicine

## 2022-11-02 DIAGNOSIS — J449 Chronic obstructive pulmonary disease, unspecified: Secondary | ICD-10-CM

## 2022-11-02 DIAGNOSIS — F17200 Nicotine dependence, unspecified, uncomplicated: Secondary | ICD-10-CM | POA: Diagnosis not present

## 2022-11-02 DIAGNOSIS — G4733 Obstructive sleep apnea (adult) (pediatric): Secondary | ICD-10-CM

## 2022-11-02 MED ORDER — AZITHROMYCIN 250 MG PO TABS: ORAL_TABLET | ORAL | 0 refills | Status: DC

## 2022-11-02 MED ORDER — TRELEGY ELLIPTA 100-62.5-25 MCG/ACT IN AEPB: 1.0000 | INHALATION_SPRAY | Freq: Every day | RESPIRATORY_TRACT | Status: DC

## 2022-11-02 MED ORDER — TRELEGY ELLIPTA 100-62.5-25 MCG/ACT IN AEPB: INHALATION_SPRAY | RESPIRATORY_TRACT | 3 refills | Status: DC

## 2022-11-02 NOTE — Assessment & Plan Note (Signed)
Making no effort to stop even while acutely ill.  Plan- Education and support

## 2022-11-02 NOTE — Assessment & Plan Note (Signed)
Acute exacerbation. Covid test neg. Plan- CXR, Zpak, Mucinex, sample and Rx's Trelegy 100

## 2022-11-02 NOTE — Patient Instructions (Addendum)
Printed script for Trelegy 100 and sample  Order- CXR   dx exacerbation COPD  Order- Covid test  dx exacerbation COPD > neg  Script sent for Zpak  Continue CPAP auto 4-20  Please skip a cigarette every time you can

## 2022-11-02 NOTE — Assessment & Plan Note (Signed)
Benefits from CPAP with good compliance and control Pan - continue auto 4-20

## 2022-11-06 ENCOUNTER — Telehealth: Payer: Self-pay | Admitting: Internal Medicine

## 2022-11-06 NOTE — Telephone Encounter (Signed)
Patient's wife states they were given Trelegy script to send to [facility] patient assistance. Patient's wife is saying [facility] told her it has to come directly from Dr. Maple Hudson. The fax number is [phone]. Patient's wife states it has to be the script with 3 refills and signed by Dr. Maple Hudson. Please advise and call patient back when it is completed.

## 2022-11-10 ENCOUNTER — Other Ambulatory Visit: Payer: Self-pay

## 2022-11-10 MED ORDER — DOXYCYCLINE HYCLATE 100 MG PO TABS: 100.0000 mg | ORAL_TABLET | Freq: Two times a day (BID) | ORAL | 0 refills | Status: DC

## 2022-11-10 NOTE — Telephone Encounter (Signed)
ATC X1 LVM  Need to know if patient received patient assistance paperwork to fill out Also please advise Dr. Maple Hudson is going to send doxycycline to patients pharmacy. Need to know what pharmacy to send script to

## 2022-11-10 NOTE — Telephone Encounter (Signed)
Patient called back and said that all the paperwork has been received and filled out.  She also said that the prescription should be sent to the [facility] in [place].  She would like it to be for 90-days w/3 refills.  Any questions, please call back at [phone]

## 2022-11-11 NOTE — Telephone Encounter (Signed)
Spoke with patients wife. Advised trelegy has been sent to [facility]. I advised it can take a few days to process everything and someone there should be reaching over the next week or so.  She states she will f/u with them by the end of the week to see if they have received paperwork yet. Closing encounter now as nfn

## 2022-11-11 NOTE — Telephone Encounter (Signed)
Pt. Calling waiting on a response to see if [facility] has received the script for the Trelgy yet please advise

## 2022-11-12 LAB — COLOGUARD: COLOGUARD: POSITIVE — AB

## 2022-11-12 NOTE — Telephone Encounter (Signed)
Called to inform our office that [facility] did not receive the fax and would like to know if he can be refaxed.  Please advise and call to confirm.  CB# [phone]

## 2022-11-13 NOTE — Telephone Encounter (Signed)
Looked in Dr. Roxy Cedar fax folder and did not see form for patient.   Idalia Needle, do you still have this form?

## 2022-11-16 ENCOUNTER — Other Ambulatory Visit: Payer: Self-pay

## 2022-11-16 MED ORDER — TRELEGY ELLIPTA 100-62.5-25 MCG/ACT IN AEPB: INHALATION_SPRAY | RESPIRATORY_TRACT | 3 refills | Status: DC

## 2022-11-16 NOTE — Telephone Encounter (Signed)
RX printed and given to Dr. Maple Hudson to sign. Will fax to [facility] once I have received the RX.

## 2022-11-16 NOTE — Telephone Encounter (Signed)
Called and spoke with patient's wife [place]. I advised her that I received a fax confirmation from [facility] for the Trelegy RX. She verbalized understanding.   Document has been sent to scan.

## 2022-11-16 NOTE — Addendum Note (Signed)
Addended by: Maurene Capes on: 11/16/2022 10:39 AM   Modules accepted: Orders

## 2022-11-18 ENCOUNTER — Telehealth: Payer: Self-pay | Admitting: Internal Medicine

## 2022-11-18 NOTE — Telephone Encounter (Signed)
Called and spoke with Sudan (DPR).  Sudan stated patient is still having chest congestion after finishing antibiotic prescribed by Dr. Maple Hudson. Sudan stated patient has some nonproductive cough, but no fever,chills, or other issues.  I offered OV for 11/19/22 and 11/20/22, but Sudan declined, because of weather conditions.     Message routed to Dr. Maple Hudson to advise

## 2022-11-18 NOTE — Telephone Encounter (Signed)
Wanted CY aware that congestion has not cleared up since 7/22 visit. Offered for patient to be seen 8/8, but they refused. Nothing available after that until 9/13 and patient did not want to wait that long. Please advise if there is anything else patient should do.

## 2022-11-18 NOTE — Telephone Encounter (Signed)
Prednisone 20 mg, # 5, 1 Daily This will raise blood sugar temporarily. Watch for any sign of fluid overload, since heart problems can also cause chest congestion.

## 2022-11-18 NOTE — Telephone Encounter (Signed)
I spoke to Andrew Mercer's wife and gave her Dr Sinclair Ship recommendation and offered her an appt for tomorrow but pt's wife said they co not make it in the office. Pt's wife stated she will try and get him in to see his pcp. Both parties verbalized understanding. NFN

## 2022-11-18 NOTE — Telephone Encounter (Signed)
Called and spoke with Andrew Mercer with patient. Dr. Roxy Cedar recommendations given.  Patient stated he can not take Prednisone, because it caused him to have upset stomach and vomiting.   Message routed to Dr. Maple Hudson

## 2022-11-18 NOTE — Telephone Encounter (Signed)
Then he is going to need to wait until he can be seen here by me or an APP, or he can go to his PCP or an urgent care

## 2022-12-29 ENCOUNTER — Ambulatory Visit: Payer: Medicare Other | Attending: Internal Medicine | Admitting: Internal Medicine

## 2022-12-29 ENCOUNTER — Ambulatory Visit: Payer: Medicare Other

## 2022-12-29 ENCOUNTER — Encounter: Payer: Self-pay | Admitting: Internal Medicine

## 2022-12-29 VITALS — BP 113/65 | HR 79 | Resp 16 | Ht 68.5 in | Wt 332.0 lb

## 2022-12-29 DIAGNOSIS — M25561 Pain in right knee: Secondary | ICD-10-CM | POA: Diagnosis not present

## 2022-12-29 DIAGNOSIS — M25562 Pain in left knee: Secondary | ICD-10-CM | POA: Diagnosis not present

## 2022-12-29 DIAGNOSIS — M1009 Idiopathic gout, multiple sites: Secondary | ICD-10-CM | POA: Insufficient documentation

## 2022-12-29 DIAGNOSIS — M1A09X0 Idiopathic chronic gout, multiple sites, without tophus (tophi): Secondary | ICD-10-CM | POA: Diagnosis not present

## 2022-12-29 DIAGNOSIS — G8929 Other chronic pain: Secondary | ICD-10-CM | POA: Insufficient documentation

## 2022-12-29 DIAGNOSIS — R200 Anesthesia of skin: Secondary | ICD-10-CM | POA: Diagnosis not present

## 2022-12-29 DIAGNOSIS — M545 Low back pain, unspecified: Secondary | ICD-10-CM | POA: Insufficient documentation

## 2022-12-29 DIAGNOSIS — R202 Paresthesia of skin: Secondary | ICD-10-CM | POA: Diagnosis present

## 2022-12-29 NOTE — Patient Instructions (Signed)
For osteoarthritis several treatments may be beneficial:  - Topical antiinflammatory medicine such as diclofenac or Voltaren can be applied to  affected area as needed. Topical analgesics containing CBD, menthol, or lidocaine can be tried.  - Oral nonsteroidal antiinflammatory drugs (NSAIDs) such as ibuprofen, aleve, celebrex, or mobic are usually helpful for osteoarthritis. These should be taken intermittently or as needed, and always taken with food.  - Turmeric has some antiinflammatory effect similar to NSAIDs and may help, if taken as a supplement should not be taken above recommended doses.   - Compressive gloves or sleeve can be helpful to support the joint especially if hurting or swelling with certain activities.  - Physical therapy referral can discuss exercises or activity modification to improve symptoms or strength if needed.  - Local steroid injection is an option if symptoms are not controlled by the above options.

## 2022-12-31 LAB — RHEUMATOID FACTOR: Rheumatoid fact SerPl-aCnc: 10 IU/mL (ref <14)

## 2022-12-31 LAB — SEDIMENTATION RATE: Sed Rate: 14 mm/h (ref 0–20)

## 2022-12-31 LAB — CYCLIC CITRUL PEPTIDE ANTIBODY, IGG: Cyclic Citrullin Peptide Ab: <16 UNITS

## 2022-12-31 LAB — URIC ACID: Uric Acid, Serum: 4.3 mg/dL (ref 4.0–8.0)

## 2023-01-18 ENCOUNTER — Encounter: Payer: Self-pay | Admitting: Internal Medicine

## 2023-01-18 ENCOUNTER — Ambulatory Visit: Payer: Medicare Other | Attending: Internal Medicine | Admitting: Internal Medicine

## 2023-01-18 VITALS — BP 119/72 | HR 78 | Resp 18 | Ht 66.5 in | Wt 333.0 lb

## 2023-01-18 DIAGNOSIS — M170 Bilateral primary osteoarthritis of knee: Secondary | ICD-10-CM | POA: Insufficient documentation

## 2023-01-18 MED ORDER — MELOXICAM 15 MG PO TABS
15.0000 mg | ORAL_TABLET | Freq: Every day | ORAL | 2 refills | Status: DC
Start: 2023-01-18 — End: 2023-03-19

## 2023-01-18 NOTE — Progress Notes (Signed)
Office Visit Note  Patient: Andrew Mercer             Date of Birth: [birth year]           MRN: [id number]             PCP: Barbie Banner, MD Referring: Barbie Banner, MD Visit Date: 01/18/2023   Subjective:  Follow-up (No improvement)   History of Present Illness: Andrew Mercer is a 72 y.o. male here for follow up for chronic joint pain and gout lab test at initial visit were entirely normal including uric acid level of 4.3.  X-rays of the knees showed left worse than right medial compartment osteoarthritis also with patellar enthesophytes.  He has not seen any significant difference still with a lot of joint pain and stiffness worse in his low back and his knees and limits his ability to stand and walk for long periods of time.  Previous HPI 12/29/22 Andrew Mercer is a 72 y.o. male for evaluation of chronic joint pain in multiple areas and with history of gout. History of classic podagra for years but the last episode was right wrist swelling about 4 years ago.  Since going on allopurinol which he takes daily has had no repeat flares now for a long time however he continues to suffer pain and stiffness and is severely limited in walking long distances due to arthritis in his back and legs. This periods with increased pain in the back and knees has morning stiffness and symptoms partially improved once he is up and walking.  However he cannot tolerate more than short time walking at a time due to his joints.  Does not notice a lot of visible joint swelling.  He does keep chronic pedal edema has been on diuretics in the past but not maintenance. . Previously seen Dr. Kellie Simmering for arthritis but sounds unclear if he was ever diagnosed with rheumatoid arthritis and he never took any disease specific medication.  He was treated with intra-articular steroid injections in the knees as needed although sometimes these just helped for weeks at a time.  Stopped following up due to Dr. Kellie Simmering  retiring has not been seeing any rheumatologist or orthopedic provider since then.  Just sees his PCP Dr. Andrey Campanile. Has takes Tylenol and Advil does not see a large improvement   Trigger finger in both hands not severely limiting activity.  Nontender nodule proximal to the right third MCP joint.  History of bilateral carpal tunnel release surgery twice on both sides.  With good outcomes in his right hand.  Left arm also including surgery for what sounds like release of ulnar neuropathy but he has subsequent partially diminished sensation throughout the left hand for years after that surgery.   Review of Systems  Constitutional:  Positive for fatigue.  HENT:  Negative for mouth sores and mouth dryness.   Eyes:  Negative for dryness.  Respiratory:  Positive for shortness of breath.   Cardiovascular:  Negative for chest pain and palpitations.  Gastrointestinal:  Negative for blood in stool, constipation and diarrhea.  Endocrine: Positive for increased urination.  Genitourinary:  Negative for involuntary urination.  Musculoskeletal:  Positive for joint pain, joint pain, myalgias, morning stiffness and myalgias. Negative for gait problem, joint swelling, muscle weakness and muscle tenderness.  Skin:  Positive for sensitivity to sunlight. Negative for color change, rash and hair loss.  Allergic/Immunologic: Negative for susceptible to infections.  Neurological:  Negative for dizziness and  headaches.  Hematological:  Negative for swollen glands.  Psychiatric/Behavioral:  Negative for depressed mood and sleep disturbance. The patient is not nervous/anxious.     PMFS History:  Patient Active Problem List   Diagnosis Date Noted   Bilateral primary osteoarthritis of knee 01/18/2023   Bilateral knee pain 12/29/2022   Low back pain 12/29/2022   Idiopathic gout of multiple sites 12/29/2022   Numbness and tingling in left hand 12/29/2022   Abnormal chest x-ray 08/29/2021   COPD mixed type (HCC)  07/13/2017   Tobacco use disorder 06/13/2015   OSA (obstructive sleep apnea) 06/09/2013   Type II diabetes mellitus, uncontrolled (HCC) 02/19/2007   Hyperlipidemia 02/19/2007   Obesity, morbid (HCC) 02/19/2007   Essential hypertension, benign 02/19/2007   DYSPNEA 02/19/2007   COUGH 02/19/2007    Past Medical History:  Diagnosis Date   COPD (chronic obstructive pulmonary disease) (HCC)    Diabetes (HCC)    TYPE II   GERD (gastroesophageal reflux disease)    Gout    HTN (hypertension)    Hyperlipidemia    Seasonal allergies    Sleep apnea    uses a cpap   Wears dentures    top   Wears glasses    reading    Family History  Problem Relation Age of Onset   Emphysema Mother    Allergies Mother    Asthma Mother    Rheum arthritis Mother    Hypertension Father    Rheum arthritis Sister    Hyperlipidemia Sister    Rheum arthritis Brother    Past Surgical History:  Procedure Laterality Date   CARPAL TUNNEL RELEASE Bilateral    CARPAL TUNNEL RELEASE Left 09/27/2013   Procedure: LEFT WRIST CARPAL TUNNEL RELEASE;  Surgeon: Nicki Reaper, MD;  Location: [facility];  Service: Orthopedics;  Laterality: Left;   COLONOSCOPY     ERCP     HEMORRHOID SURGERY  2011   MENISCUS REPAIR Left early 2000s   NASAL SINUS SURGERY  1998   x2   PAROTID GLAND TUMOR EXCISION Bilateral 2012   TONSILLECTOMY     ULNAR NERVE TRANSPOSITION Left 09/27/2013   Procedure: RELEASE POSSIBLE TRANSPOSITION ULNAR NERVE LEFT ELBOW ;  Surgeon: Nicki Reaper, MD;  Location: [facility];  Service: Orthopedics;  Laterality: Left;   Social History   Social History Narrative   Not on file   Immunization History  Administered Date(s) Administered   Fluad Quad(high Dose 65+) 12/07/2018, 01/21/2021   Influenza Split 02/08/2014, 01/12/2016   Influenza, High Dose Seasonal PF 01/08/2016, 01/27/2017, 01/13/2018, 01/13/2018, 01/12/2020, 01/21/2021   Influenza,inj,Quad PF,6+ Mos 01/12/2015    Influenza-Unspecified 01/12/2015, 12/27/2019, 01/06/2022   PFIZER Comirnaty(Gray Top)Covid-19 Tri-Sucrose Vaccine 07/27/2020   PFIZER(Purple Top)SARS-COV-2 Vaccination 05/08/2019, 05/29/2019, 12/06/2019, 02/12/2020   PNEUMOCOCCAL CONJUGATE-20 01/06/2022   Pfizer Covid-19 Vaccine Bivalent Booster 3yrs & up 01/15/2021   Pfizer(Comirnaty)Fall Seasonal Vaccine 12 years and older 01/19/2022   Pneumococcal Conjugate-13 01/12/2015, 02/18/2015   Pneumococcal Polysaccharide-23 06/10/2012, 04/22/2017   Respiratory Syncytial Virus Vaccine,Recomb Aduvanted(Arexvy) 01/06/2022   Td 05/14/2016   Tdap 06/15/2006   Zoster Recombinant(Shingrix) 11/26/2019, 02/19/2020   Zoster, Live 12/08/2013     Objective: Vital Signs: BP 119/72 (BP Location: Left Arm, Patient Position: Sitting, Cuff Size: Normal)   Pulse 78   Resp 18   Ht 5' 6.5" (1.689 m)   Wt (!) 333 lb (151 kg)   BMI 52.94 kg/m    Physical Exam Cardiovascular:  Rate and Rhythm: Normal rate and regular rhythm.  Pulmonary:     Effort: Pulmonary effort is normal.     Breath sounds: Normal breath sounds.  Musculoskeletal:     Comments: Trace pitting edema both legs  Skin:    General: Skin is warm and dry.  Neurological:     Mental Status: He is alert.  Psychiatric:        Mood and Affect: Mood normal.      Musculoskeletal Exam: Shoulders full ROM no tenderness or swelling Elbows full ROM no tenderness or swelling Wrists full ROM no tenderness or swelling Fingers full ROM no tenderness or swelling, nontender nodule on palm proximal to right third MCP joint Left knee anterior joint line tenderness to pressure and pain with full extension range of motion, right knee no tenderness, no palpable effusions Ankles full ROM no tenderness or swelling  Investigation: No additional findings.  Imaging: XR KNEE 3 VIEW LEFT  Result Date: 12/29/2022 X-ray left knee 3 views There is incomplete but severe joint space narrowing and medial  compartment with no lateral osteophyte formation.  Patellofemoral compartment space slightly narrowed laterally with marginal osteophyte.  Very small superior patellar enthesophytes.  Small posterior vascular calcifications.  No visible joint effusion. Impression Moderate to severely advanced medial compartment osteo arthritis mild patellofemoral arthritis  XR KNEE 3 VIEW RIGHT  Result Date: 12/29/2022 X-ray right knee 3 views There is medial compartment joint space narrowing with slight increase sclerosis on tibial surface no lateral osteophytes.  Patellofemoral joint spaces preserved small marginal osteophytes.  There is prominent quadriceps tendon insertion patellar enthesophyte.  There are scattered posterior vascular calcifications.  No visible joint effusion. Impression Visible medial and patellofemoral compartment osteoarthritis and superior patellar enthesophyte   Recent Labs: Lab Results  Component Value Date   WBC 7.6 07/12/2009   HGB 15.3 09/27/2013   PLT 188 07/12/2009   NA 135 04/09/2015   K 4.9 04/09/2015   CL 99 04/09/2015   CO2 23 04/09/2015   GLUCOSE 188 (H) 04/09/2015   BUN 16 04/09/2015   CREATININE 1.30 (H) 10/23/2022   CALCIUM 10.1 04/09/2015   GFRAA  07/12/2009    >60        The eGFR has been calculated using the MDRD equation. This calculation has not been validated in all clinical situations. eGFR's persistently <60 mL/min signify possible Chronic Kidney Disease.    Speciality Comments: No specialty comments available.  Procedures:  No procedures performed Allergies: Shellfish allergy, Statins, Clindamycin/lincomycin, and Lisinopril   Assessment / Plan:     Visit Diagnoses: Bilateral primary osteoarthritis of knee - Plan: meloxicam (MOBIC) 15 MG tablet  Current symptoms appear attributable to degenerative arthritis in multiple areas with no evidence of any additional inflammatory process contributing.  We discussed management of osteoarthritis he was  not interested in any injections or physical therapy as he has tried all these before with limited and/or short-lived benefit.  Also discussed diet and supplement options or use of supportive devices.  I see he was previously recommended against use of long-term NSAIDs due to chronic kidney disease but based on most recent metabolic panel estimated GFR is above 60.  I recommend we can try resuming meloxicam 15 mg once daily for now but will need to recheck his labs afterwards recommend by about 8 weeks if he is otherwise tolerating the medication okay..  Orders: No orders of the defined types were placed in this encounter.  Meds ordered this encounter  Medications  meloxicam (MOBIC) 15 MG tablet    Sig: Take 1 tablet (15 mg total) by mouth daily.    Dispense:  30 tablet    Refill:  2     Follow-Up Instructions: Return if symptoms worsen or fail to improve.   Fuller Plan, MD  Note - This record has been created using [employer].  Chart creation errors have been sought, but may not always  have been located. Such creation errors do not reflect on  the standard of medical care.

## 2023-01-18 NOTE — Patient Instructions (Signed)

## 2023-01-29 ENCOUNTER — Other Ambulatory Visit: Payer: Self-pay | Admitting: Gastroenterology

## 2023-02-26 ENCOUNTER — Encounter (HOSPITAL_COMMUNITY): Payer: Self-pay | Admitting: Gastroenterology

## 2023-03-04 NOTE — Anesthesia Preprocedure Evaluation (Addendum)
Anesthesia Evaluation  Patient identified by MRN, date of birth, ID band Patient awake    Reviewed: Allergy & Precautions, NPO status , Patient's Chart, lab work & pertinent test results  History of Anesthesia Complications Negative for: history of anesthetic complications  Airway Mallampati: III  TM Distance: >3 FB Neck ROM: Full    Dental  (+) Dental Advisory Given, Edentulous Upper, Partial Lower   Pulmonary sleep apnea and Continuous Positive Airway Pressure Ventilation , COPD,  COPD inhaler, Current SmokerPatient did not abstain from smoking.   Pulmonary exam normal        Cardiovascular hypertension, Pt. on medications Normal cardiovascular exam     Neuro/Psych negative neurological ROS  negative psych ROS   GI/Hepatic Neg liver ROS,GERD  Medicated and Controlled,,  Endo/Other  diabetes, Type 2, Oral Hypoglycemic Agents, Insulin Dependent  Class 4 obesity  Renal/GU negative Renal ROS     Musculoskeletal  (+) Arthritis ,    Abdominal  (+) + obese  Peds  Hematology negative hematology ROS (+)   Anesthesia Other Findings On GLP-1a   Reproductive/Obstetrics                              Anesthesia Physical Anesthesia Plan  ASA: 3  Anesthesia Plan: MAC   Post-op Pain Management: Minimal or no pain anticipated   Induction:   PONV Risk Score and Plan: 0 and Propofol infusion and Treatment may vary due to age or medical condition  Airway Management Planned: Nasal Cannula and Natural Airway  Additional Equipment: None  Intra-op Plan:   Post-operative Plan:   Informed Consent: I have reviewed the patients History and Physical, chart, labs and discussed the procedure including the risks, benefits and alternatives for the proposed anesthesia with the patient or authorized representative who has indicated his/her understanding and acceptance.       Plan Discussed with: CRNA  and Anesthesiologist  Anesthesia Plan Comments:         Anesthesia Quick Evaluation

## 2023-03-05 ENCOUNTER — Ambulatory Visit (HOSPITAL_COMMUNITY): Payer: Medicare Other | Admitting: Anesthesiology

## 2023-03-05 ENCOUNTER — Ambulatory Visit (HOSPITAL_BASED_OUTPATIENT_CLINIC_OR_DEPARTMENT_OTHER): Payer: Medicare Other | Admitting: Anesthesiology

## 2023-03-05 ENCOUNTER — Other Ambulatory Visit: Payer: Self-pay

## 2023-03-05 ENCOUNTER — Ambulatory Visit (HOSPITAL_COMMUNITY)
Admission: RE | Admit: 2023-03-05 | Discharge: 2023-03-05 | Disposition: A | Discharge status: Home / Self Care | Payer: Medicare Other | Attending: Gastroenterology | Admitting: Gastroenterology

## 2023-03-05 ENCOUNTER — Encounter (HOSPITAL_COMMUNITY)
Admission: RE | Disposition: A | Discharge status: Home / Self Care | Payer: Self-pay | Source: Home / Self Care | Attending: Gastroenterology

## 2023-03-05 ENCOUNTER — Encounter (HOSPITAL_COMMUNITY): Payer: Self-pay | Admitting: Gastroenterology

## 2023-03-05 DIAGNOSIS — Z1211 Encounter for screening for malignant neoplasm of colon: Secondary | ICD-10-CM | POA: Diagnosis present

## 2023-03-05 DIAGNOSIS — D123 Benign neoplasm of transverse colon: Secondary | ICD-10-CM | POA: Insufficient documentation

## 2023-03-05 DIAGNOSIS — E119 Type 2 diabetes mellitus without complications: Secondary | ICD-10-CM | POA: Diagnosis not present

## 2023-03-05 DIAGNOSIS — K2950 Unspecified chronic gastritis without bleeding: Secondary | ICD-10-CM | POA: Insufficient documentation

## 2023-03-05 DIAGNOSIS — G4730 Sleep apnea, unspecified: Secondary | ICD-10-CM | POA: Insufficient documentation

## 2023-03-05 DIAGNOSIS — D122 Benign neoplasm of ascending colon: Secondary | ICD-10-CM

## 2023-03-05 DIAGNOSIS — D120 Benign neoplasm of cecum: Secondary | ICD-10-CM | POA: Diagnosis not present

## 2023-03-05 DIAGNOSIS — Z79899 Other long term (current) drug therapy: Secondary | ICD-10-CM | POA: Insufficient documentation

## 2023-03-05 DIAGNOSIS — K3189 Other diseases of stomach and duodenum: Secondary | ICD-10-CM | POA: Insufficient documentation

## 2023-03-05 DIAGNOSIS — K5730 Diverticulosis of large intestine without perforation or abscess without bleeding: Secondary | ICD-10-CM | POA: Insufficient documentation

## 2023-03-05 DIAGNOSIS — K5909 Other constipation: Secondary | ICD-10-CM | POA: Insufficient documentation

## 2023-03-05 DIAGNOSIS — F17210 Nicotine dependence, cigarettes, uncomplicated: Secondary | ICD-10-CM | POA: Diagnosis not present

## 2023-03-05 DIAGNOSIS — R1032 Left lower quadrant pain: Secondary | ICD-10-CM | POA: Diagnosis not present

## 2023-03-05 DIAGNOSIS — Z794 Long term (current) use of insulin: Secondary | ICD-10-CM | POA: Insufficient documentation

## 2023-03-05 DIAGNOSIS — Z7984 Long term (current) use of oral hypoglycemic drugs: Secondary | ICD-10-CM | POA: Diagnosis not present

## 2023-03-05 DIAGNOSIS — K219 Gastro-esophageal reflux disease without esophagitis: Secondary | ICD-10-CM | POA: Insufficient documentation

## 2023-03-05 DIAGNOSIS — J449 Chronic obstructive pulmonary disease, unspecified: Secondary | ICD-10-CM | POA: Insufficient documentation

## 2023-03-05 DIAGNOSIS — I10 Essential (primary) hypertension: Secondary | ICD-10-CM | POA: Diagnosis not present

## 2023-03-05 DIAGNOSIS — K2970 Gastritis, unspecified, without bleeding: Secondary | ICD-10-CM | POA: Diagnosis not present

## 2023-03-05 HISTORY — PX: BIOPSY: SHX5522

## 2023-03-05 HISTORY — PX: COLONOSCOPY WITH PROPOFOL: SHX5780

## 2023-03-05 HISTORY — PX: POLYPECTOMY: SHX5525

## 2023-03-05 HISTORY — PX: ESOPHAGOGASTRODUODENOSCOPY (EGD) WITH PROPOFOL: SHX5813

## 2023-03-05 LAB — GLUCOSE, CAPILLARY: Glucose-Capillary: 95 mg/dL (ref 70–99)

## 2023-03-05 SURGERY — ESOPHAGOGASTRODUODENOSCOPY (EGD) WITH PROPOFOL
Anesthesia: Monitor Anesthesia Care

## 2023-03-05 MED ORDER — PROPOFOL 500 MG/50ML IV EMUL
INTRAVENOUS | Status: DC | PRN
  Administered 2023-03-05: 125 ug/kg/min via INTRAVENOUS

## 2023-03-05 MED ORDER — PROPOFOL 500 MG/50ML IV EMUL
INTRAVENOUS | Status: AC
  Filled 2023-03-05: qty 100

## 2023-03-05 MED ORDER — SODIUM CHLORIDE 0.9 % IV SOLN: INTRAVENOUS | Status: DC

## 2023-03-05 MED ORDER — PROPOFOL 10 MG/ML IV BOLUS
INTRAVENOUS | Status: DC | PRN
  Administered 2023-03-05: 40 mg via INTRAVENOUS
  Administered 2023-03-05: 30 mg via INTRAVENOUS

## 2023-03-05 SURGICAL SUPPLY — 23 items
BLOCK BITE 60FR ADLT L/F BLUE (MISCELLANEOUS) ×2 IMPLANT
ELECT REM PT RETURN 9FT ADLT (ELECTROSURGICAL)
ELECTRODE REM PT RTRN 9FT ADLT (ELECTROSURGICAL) IMPLANT
FCP BXJMBJMB 240X2.8X (CUTTING FORCEPS)
FLOOR PAD 36X40 (MISCELLANEOUS) ×2
FORCEP RJ3 GP 1.8X160 W-NEEDLE (CUTTING FORCEPS) IMPLANT
FORCEPS BIOP RAD 4 LRG CAP 4 (CUTTING FORCEPS) IMPLANT
FORCEPS BXJMBJMB 240X2.8X (CUTTING FORCEPS) IMPLANT
INJECTOR/SNARE I SNARE (MISCELLANEOUS) IMPLANT
LUBRICANT JELLY 4.5OZ STERILE (MISCELLANEOUS) IMPLANT
MANIFOLD NEPTUNE II (INSTRUMENTS) IMPLANT
NDL SCLEROTHERAPY 25GX240 (NEEDLE) IMPLANT
NEEDLE SCLEROTHERAPY 25GX240 (NEEDLE) IMPLANT
PAD FLOOR 36X40 (MISCELLANEOUS) ×2 IMPLANT
PROBE APC STR FIRE (PROBE) IMPLANT
PROBE INJECTION GOLD 7FR (MISCELLANEOUS) IMPLANT
SNARE ROTATE MED OVAL 20MM (MISCELLANEOUS) IMPLANT
SNARE SHORT THROW 13M SML OVAL (MISCELLANEOUS) IMPLANT
SYR 50ML LL SCALE MARK (SYRINGE) IMPLANT
TRAP SPECIMEN MUCOUS 40CC (MISCELLANEOUS) IMPLANT
TUBING ENDO SMARTCAP PENTAX (MISCELLANEOUS) ×4 IMPLANT
TUBING IRRIGATION ENDOGATOR (MISCELLANEOUS) ×2 IMPLANT
WATER STERILE IRR 1000ML POUR (IV SOLUTION) IMPLANT

## 2023-03-05 NOTE — H&P (Signed)
Andrew Mercer HPI: This a 72 year old white male presents to the office for evaluation of left lower quadrant pain that he has had for couple of weeks. He is accompanied to the office by his wife. He feels bloated despite having 2 BM's per day. There is no obvious blood or mucus in the stool. He suffers from chronic constipation and has to take Miralax every day to facilitate BM's but his bowel movements have been regular for several months now. Gaviscon seems to help the left lower quadrant pain he is having. He has a good appetite and his weight has been stable. He denies having any complaints of abdominal pain, nausea, vomiting, acid reflux, dysphagia or odynophagia. He denies having a family history of colon cancer, celiac sprue or IBD. He had a normal colonoscopy in 2009 and was advised to have a Cologuard several years ago which she did not do.  Past Medical History:  Diagnosis Date   COPD (chronic obstructive pulmonary disease) (HCC)    Diabetes (HCC)    TYPE II   GERD (gastroesophageal reflux disease)    Gout    HTN (hypertension)    Hyperlipidemia    Seasonal allergies    Sleep apnea    uses a cpap   Wears dentures    top   Wears glasses    reading    Past Surgical History:  Procedure Laterality Date   CARPAL TUNNEL RELEASE Bilateral    CARPAL TUNNEL RELEASE Left 09/27/2013   Procedure: LEFT WRIST CARPAL TUNNEL RELEASE;  Surgeon: Andrew Reaper, MD;  Location: [facility];  Service: Orthopedics;  Laterality: Left;   COLONOSCOPY     ERCP     HEMORRHOID SURGERY  2011   MENISCUS REPAIR Left early 2000s   NASAL SINUS SURGERY  1998   x2   PAROTID GLAND TUMOR EXCISION Bilateral 2012   TONSILLECTOMY     ULNAR NERVE TRANSPOSITION Left 09/27/2013   Procedure: RELEASE POSSIBLE TRANSPOSITION ULNAR NERVE LEFT ELBOW ;  Surgeon: Andrew Reaper, MD;  Location: [facility];  Service: Orthopedics;  Laterality: Left;    Family History  Problem Relation Age of  Onset   Emphysema Mother    Allergies Mother    Asthma Mother    Rheum arthritis Mother    Hypertension Father    Rheum arthritis Sister    Hyperlipidemia Sister    Rheum arthritis Brother     Social History:  reports that he has been smoking cigarettes. He started smoking about 58 years ago. He has a 88.3 pack-year smoking history. He has never been exposed to tobacco smoke. He has never used smokeless tobacco. He reports that he does not drink alcohol and does not use drugs.  Allergies:  Allergies  Allergen Reactions   Shellfish Allergy Anaphylaxis    ONLY CLAMS   Statins     Leg cramps/liver problems   Clindamycin/Lincomycin     Adverse effects   Lisinopril     Other reaction(s): Unknown    Medications: Scheduled: Continuous:  sodium chloride      No results found for this or any previous visit (from the past 24 hour(s)).   No results found.  ROS:  As stated above in the HPI otherwise negative.  Blood pressure (!) 170/79, pulse 77, temperature 97.9 F (36.6 C), temperature source Temporal, resp. rate (!) 22, height 5\' 8"  (1.727 m), weight (!) 151 kg, SpO2 97%.    PE: Gen: NAD,  Alert and Oriented HEENT:  [place]/AT, EOMI Neck: Supple, no LAD Lungs: CTA Bilaterally CV: RRR without M/G/R ABD: Soft, NTND, +BS Ext: No C/C/E  Assessment/Plan: 1) Screening colonoscopy. 2) GERD - EGD.  Andrew Mercer D 03/05/2023, 7:17 AM

## 2023-03-05 NOTE — Anesthesia Procedure Notes (Signed)
Procedure Name: MAC Date/Time: 03/05/2023 7:33 AM  Performed by: Vanessa [place], CRNAPre-anesthesia Checklist: Patient identified, Emergency Drugs available, Suction available and Patient being monitored Patient Re-evaluated:Patient Re-evaluated prior to induction Oxygen Delivery Method: Simple face mask

## 2023-03-05 NOTE — Op Note (Signed)
[facility] Patient Name: Andrew Mercer Procedure Date: 03/05/2023 MRN: [id number] Attending MD: Jeani Hawking , MD, [id number] Date of Birth: 07-10-1950 CSN: [id number] Age: 72 Admit Type: Outpatient Procedure:                Upper GI endoscopy Indications:              Heartburn Providers:                Jeani Hawking, MD, Suzy Bouchard, RN, Geoffery Lyons,                            Technician Referring MD:              Medicines:                Propofol per Anesthesia Complications:            No immediate complications. Estimated Blood Loss:     Estimated blood loss: none. Procedure:                Pre-Anesthesia Assessment:                           - Prior to the procedure, a History and Physical                            was performed, and patient medications and                            allergies were reviewed. The patient's tolerance of                            previous anesthesia was also reviewed. The risks                            and benefits of the procedure and the sedation                            options and risks were discussed with the patient.                            All questions were answered, and informed consent                            was obtained. Prior Anticoagulants: The patient has                            taken no anticoagulant or antiplatelet agents. ASA                            Grade Assessment: III - A patient with severe                            systemic disease. After reviewing the risks and                            benefits,  the patient was deemed in satisfactory                            condition to undergo the procedure.                           - Sedation was administered by an anesthesia                            professional. Deep sedation was attained.                           After obtaining informed consent, the endoscope was                            passed under direct vision. Throughout the                             procedure, the patient's blood pressure, pulse, and                            oxygen saturations were monitored continuously. The                            [id number]-HQ190L ([id number] Olympus colonoscope was                            introduced through the mouth, and advanced to the                            second part of duodenum. The upper GI endoscopy was                            accomplished without difficulty. The patient                            tolerated the procedure well. Scope In: Scope Out: Findings:      The esophagus was normal.      Diffuse mild inflammation characterized by erythema was found in the       entire examined stomach. Biopsies were taken with a cold forceps for       Helicobacter pylori testing.      The examined duodenum was normal. The images for the duodenum were       associated with the colon report and it was not successfully transferred       over. Impression:               - Normal esophagus.                           - Gastritis. Biopsied.                           - Normal examined duodenum. Moderate Sedation:      Not Applicable - Patient had care per Anesthesia. Recommendation:           -  Patient has a contact number available for                            emergencies. The signs and symptoms of potential                            delayed complications were discussed with the                            patient. Return to normal activities tomorrow.                            Written discharge instructions were provided to the                            patient.                           - Resume previous diet.                           - Continue present medications.                           - Await pathology results. Procedure Code(s):        --- Professional ---                           [phone], Esophagogastroduodenoscopy, flexible,                            transoral; with biopsy, single or multiple Diagnosis  Code(s):        --- Professional ---                           K29.70, Gastritis, unspecified, without bleeding                           R12, Heartburn CPT copyright 2022 American Medical Association. All rights reserved. The codes documented in this report are preliminary and upon coder review may  be revised to meet current compliance requirements. Jeani Hawking, MD Jeani Hawking, MD 03/05/2023 8:28:03 AM This report has been signed electronically. Number of Addenda: 0

## 2023-03-05 NOTE — Op Note (Signed)
[facility] Patient Name: Andrew Mercer Procedure Date: 03/05/2023 MRN: [id number] Attending MD: Jeani Hawking , MD, [id number] Date of Birth: 1950-06-24 CSN: [id number] Age: 72 Admit Type: Outpatient Procedure:                Colonoscopy Indications:              Screening for colorectal malignant neoplasm Providers:                Jeani Hawking, MD, Suzy Bouchard, RN, Geoffery Lyons,                            Technician Referring MD:              Medicines:                Propofol per Anesthesia Complications:            No immediate complications. Estimated Blood Loss:     Estimated blood loss: none. Procedure:                Pre-Anesthesia Assessment:                           - Prior to the procedure, a History and Physical                            was performed, and patient medications and                            allergies were reviewed. The patient's tolerance of                            previous anesthesia was also reviewed. The risks                            and benefits of the procedure and the sedation                            options and risks were discussed with the patient.                            All questions were answered, and informed consent                            was obtained. Prior Anticoagulants: The patient has                            taken no anticoagulant or antiplatelet agents. ASA                            Grade Assessment: III - A patient with severe                            systemic disease. After reviewing the risks and  benefits, the patient was deemed in satisfactory                            condition to undergo the procedure.                           - Sedation was administered by an anesthesia                            professional. Deep sedation was attained.                           After obtaining informed consent, the colonoscope                            was passed under  direct vision. Throughout the                            procedure, the patient's blood pressure, pulse, and                            oxygen saturations were monitored continuously. The                            PCF-HQ190L ([id number]) Olympus colonoscope was                            introduced through the anus and advanced to the the                            cecum, identified by appendiceal orifice and                            ileocecal valve. The colonoscopy was somewhat                            difficult. The patient tolerated the procedure                            well. The quality of the bowel preparation was                            evaluated using the BBPS [facility] Bowel Preparation                            Scale) with scores of: Right Colon = 3 (entire                            mucosa seen well with no residual staining, small                            fragments of stool or opaque liquid), Transverse  Colon = 2 (minor amount of residual staining, small                            fragments of stool and/or opaque liquid, but mucosa                            seen well) and Left Colon = 2 (minor amount of                            residual staining, small fragments of stool and/or                            opaque liquid, but mucosa seen well). The total                            BBPS score equals 7. The quality of the bowel                            preparation was good. The ileocecal valve,                            appendiceal orifice, and rectum were photographed. Scope In: 7:45:11 AM Scope Out: 8:08:46 AM Scope Withdrawal Time: 0 hours 17 minutes 28 seconds  Total Procedure Duration: 0 hours 23 minutes 35 seconds  Findings:      Greater than 10 sessile polyps were found in the transverse colon and       ascending colon. The polyps were 2 to 5 mm in size. These polyps were       removed with a cold snare. Resection and retrieval were  complete.      Scattered small-mouthed diverticula were found in the sigmoid colon. Impression:               - Greater than 10 2 to 5 mm polyps in the                            transverse colon and in the ascending colon,                            removed with a cold snare. Resected and retrieved.                           - Diverticulosis in the sigmoid colon. Moderate Sedation:      Not Applicable - Patient had care per Anesthesia. Recommendation:           - Patient has a contact number available for                            emergencies. The signs and symptoms of potential                            delayed complications were discussed with the  patient. Return to normal activities tomorrow.                            Written discharge instructions were provided to the                            patient.                           - Resume previous diet.                           - Continue present medications.                           - Await pathology results.                           - Repeat colonoscopy in 1 year for surveillance. Procedure Code(s):        --- Professional ---                           [phone], Colonoscopy, flexible; with removal of                            tumor(s), polyp(s), or other lesion(s) by snare                            technique Diagnosis Code(s):        --- Professional ---                           D12.3, Benign neoplasm of transverse colon (hepatic                            flexure or splenic flexure)                           Z12.11, Encounter for screening for malignant                            neoplasm of colon                           D12.2, Benign neoplasm of ascending colon                           K57.30, Diverticulosis of large intestine without                            perforation or abscess without bleeding CPT copyright 2022 American Medical Association. All rights reserved. The codes documented in  this report are preliminary and upon coder review may  be revised to meet current compliance requirements. Jeani Hawking, MD Jeani Hawking, MD 03/05/2023 8:16:20 AM This report has been signed electronically. Number of Addenda: 0

## 2023-03-05 NOTE — Anesthesia Postprocedure Evaluation (Signed)
Anesthesia Post Note  Patient: Andrew Mercer  Procedure(s) Performed: ESOPHAGOGASTRODUODENOSCOPY (EGD) WITH PROPOFOL COLONOSCOPY WITH PROPOFOL BIOPSY POLYPECTOMY     Patient location during evaluation: PACU Anesthesia Type: MAC Level of consciousness: awake and alert Pain management: pain level controlled Vital Signs Assessment: post-procedure vital signs reviewed and stable Respiratory status: spontaneous breathing, nonlabored ventilation and respiratory function stable Cardiovascular status: stable and blood pressure returned to baseline Anesthetic complications: no   No notable events documented.  Last Vitals:  Vitals:   03/05/23 0840 03/05/23 0850  BP: (!) 161/76 (!) 147/61  Pulse: 79 74  Resp: (!) 21 14  Temp:    SpO2: 97% 97%    Last Pain:  Vitals:   03/05/23 0850  TempSrc:   PainSc: 0-No pain                 Beryle Lathe

## 2023-03-05 NOTE — Discharge Instructions (Signed)
YOU HAD AN ENDOSCOPIC PROCEDURE TODAY: Refer to the procedure report and other information in the discharge instructions given to you for any specific questions about what was found during the examination. If this information does not answer your questions, please call [facility] at [phone] to clarify.   YOU SHOULD EXPECT: Some feelings of bloating in the abdomen. Passage of more gas than usual. Walking can help get rid of the air that was put into your GI tract during the procedure and reduce the bloating. If you had a lower endoscopy (such as a colonoscopy or flexible sigmoidoscopy) you may notice spotting of blood in your stool or on the toilet paper. Some abdominal soreness may be present for a day or two, also.  DIET: Your first meal following the procedure should be a light meal and then it is ok to progress to your normal diet. A half-sandwich or bowl of soup is an example of a good first meal. Heavy or fried foods are harder to digest and may make you feel nauseous or bloated. Drink plenty of fluids but you should avoid alcoholic beverages for 24 hours. If you had an esophageal dilation, please see attached information for diet.   ACTIVITY: Your care partner should take you home directly after the procedure. You should plan to take it easy, moving slowly for the rest of the day. You can resume normal activity the day after the procedure however YOU SHOULD NOT DRIVE, use power tools, machinery or perform tasks that involve climbing or major physical exertion for 24 hours (because of the sedation medicines used during the test).   SYMPTOMS TO REPORT IMMEDIATELY: A gastroenterologist can be reached at any hour. Please call [phone]  for any of the following symptoms:  Following lower endoscopy (colonoscopy, flexible sigmoidoscopy) Excessive amounts of blood in the stool  Significant tenderness, worsening of abdominal pains  Swelling of the abdomen that is new, acute  Fever of  100 or higher  Following upper endoscopy (EGD, EUS, ERCP, esophageal dilation) Vomiting of blood or coffee ground material  New, significant abdominal pain  New, significant chest pain or pain under the shoulder blades  Painful or persistently difficult swallowing  New shortness of breath  Black, tarry-looking or red, bloody stools  FOLLOW UP:  If any biopsies were taken you will be contacted by phone or by letter within the next 1-3 weeks. Call [phone]  if you have not heard about the biopsies in 3 weeks.  Please also call with any specific questions about appointments or follow up tests.

## 2023-03-05 NOTE — Transfer of Care (Signed)
Immediate Anesthesia Transfer of Care Note  Patient: Andrew Mercer  Procedure(s) Performed: ESOPHAGOGASTRODUODENOSCOPY (EGD) WITH PROPOFOL COLONOSCOPY WITH PROPOFOL BIOPSY POLYPECTOMY  Patient Location: Endoscopy Unit  Anesthesia Type:MAC  Level of Consciousness: awake and patient cooperative  Airway & Oxygen Therapy: Patient Spontanous Breathing and Patient connected to face mask  Post-op Assessment: Report given to RN and Post -op Vital signs reviewed and stable  Post vital signs: Reviewed and stable  Last Vitals:  Vitals Value Taken Time  BP    Temp    Pulse 73 03/05/23 0816  Resp 19 03/05/23 0816  SpO2 95 % 03/05/23 0816  Vitals shown include unfiled device data.  Last Pain:  Vitals:   03/05/23 0702  TempSrc: Temporal  PainSc: 5          Complications: No notable events documented.

## 2023-03-07 ENCOUNTER — Encounter (HOSPITAL_COMMUNITY): Payer: Self-pay | Admitting: Gastroenterology

## 2023-03-08 LAB — SURGICAL PATHOLOGY

## 2023-03-16 ENCOUNTER — Telehealth: Payer: Self-pay

## 2023-03-16 DIAGNOSIS — M170 Bilateral primary osteoarthritis of knee: Secondary | ICD-10-CM

## 2023-03-16 NOTE — Telephone Encounter (Signed)
Patient's wife contacted the office and states the patient needs to get a kidney lab redone at the beginning of December per Dr. Dimple Casey after starting Meloxicam. Reviewed the patient's chart and the office visit note from 01/18/2023 stating we can try resuming meloxicam 15 mg once daily for now but will need to recheck his labs afterwards recommend by about 8 weeks if he is otherwise tolerating the medication okay. Patient's wife states they plan to come to the office tomorrow morning to get the lab done and would like a call with the results so that they know whether to pick up the last refill of meloxicam or not. Please advise. Labs that are pended are not associated with a diagnoses yet.

## 2023-03-16 NOTE — Telephone Encounter (Signed)
Advised Dr. Dimple Casey Signed BMP for tmrw which is the only lab we need for meloxicam right now. Patient and patient's wife verbalized understanding.

## 2023-03-16 NOTE — Telephone Encounter (Signed)
Signed BMP for tmrw which is the only lab we need for meloxicam right now

## 2023-03-17 ENCOUNTER — Other Ambulatory Visit: Payer: Self-pay | Admitting: *Deleted

## 2023-03-17 DIAGNOSIS — M170 Bilateral primary osteoarthritis of knee: Secondary | ICD-10-CM

## 2023-03-17 LAB — COMPLETE METABOLIC PANEL WITH GFR
AG Ratio: 1.5 (calc) (ref 1.0–2.5)
ALT: 77 U/L — ABNORMAL HIGH (ref 9–46)
AST: 56 U/L — ABNORMAL HIGH (ref 10–35)
Albumin: 3.8 g/dL (ref 3.6–5.1)
Alkaline phosphatase (APISO): 44 U/L (ref 35–144)
BUN: 18 mg/dL (ref 7–25)
CO2: 26 mmol/L (ref 20–32)
Calcium: 9.3 mg/dL (ref 8.6–10.3)
Chloride: 101 mmol/L (ref 98–110)
Creat: 1.08 mg/dL (ref 0.70–1.28)
Globulin: 2.5 g/dL (ref 1.9–3.7)
Glucose, Bld: 158 mg/dL — ABNORMAL HIGH (ref 65–99)
Potassium: 5.3 mmol/L (ref 3.5–5.3)
Sodium: 135 mmol/L (ref 135–146)
Total Bilirubin: 0.6 mg/dL (ref 0.2–1.2)
Total Protein: 6.3 g/dL (ref 6.1–8.1)
eGFR: 72 mL/min/{1.73_m2} (ref >=60)

## 2023-03-19 ENCOUNTER — Telehealth: Payer: Self-pay | Admitting: Internal Medicine

## 2023-03-19 NOTE — Telephone Encounter (Signed)
Advised patient's wife that per Dr. Dimple Casey,  Liver enzyme tests are worse compared to his previous with AST of 56 ALT of 77. Kidney function is normal. Unfortunately I think he needs to stop taking the meloxicam because it may be causing the change.     She verbalized understanding and states that patient will not take meloxicam any longer. Medication has been discontinued from medication list per Dr. Gregary Cromer recommendation to stop taking.

## 2023-03-19 NOTE — Telephone Encounter (Signed)
Pt asked if his lab results has came in. Pt would like a call back with those as soon as possible.

## 2023-03-19 NOTE — Telephone Encounter (Signed)
Liver enzyme tests are worse compared to his previous with AST of 56 ALT of 77. Kidney function is normal. Unfortunately I think he needs to stop taking the meloxicam because it may be causing the change.

## 2023-05-05 NOTE — Progress Notes (Unsigned)
Subjective:    Patient ID: Andrew Mercer, male    DOB: Mar 21, 1951, 73 y.o.   MRN: [id number]  HPI   male smoker followed for OSA, COPD, complicated by tobacco use, Morbid obesity, DM 2, HBP, GERD NPSG 2004, AHI 9/ hr Office Spirometry 07/13/17-moderate obstruction, moderate restriction of exhaled volume likely reflecting both air trapping and obesity hypoventilation.  FVC 2.40/51%, FEV1 1.83/53%, ratio 0.76, FEF 25-75%, 1.56/ 59% PFT 04/04/21- moderate obstruction with no resp to BD. ----------------------------------------------------------------------------   11/02/22- 73 year old male Smoker ( 1.5 ppd/ 87 pkyrs) followed for OSA, COPD,  complicated by tobacco use, obesity, DM 2, HBP, GERD, CAD/ ASCVD, Peripheral Edema,   -Breztri or Trelegy 100  CPAP auto 4-20/  Adapt Download- compliance      100%, AHI 0.8/ hr  Body weight today- 326 lbs -----Chest cold and congestion Patient and wife both caught chest infection from visitor last week.. No fever, but increased cough, hard to clear chest. Increased dyspnea. Trelegy sample helped- request manufacturer assistance. Covid test here negative.  Still smoking- discussed again. Download reviewed. CPAP remains helpful. CXR 08/31/22-  IMPRESSION: No acute findings.  05/06/23- 73 year old male Smoker (recalcitrant 1.5 ppd/ 88 pkyrs) followed for OSA, COPD,  complicated by tobacco use, obesity, DM 2, HBP, GERD, CAD/ ASCVD, Peripheral Edema,  - Trelegy 100  CPAP auto 4-20/  Adapt Download- compliance   - 100%, AHI 0.2/hr Body weight today- 336 lbs Smoking 2 ppd- not trying to quit CXR 11/02/22- IMPRESSION: Peribronchial thickening suggest bronchitis. There are no signs of pulmonary edema or focal pulmonary consolidation  Discussed the use of AI scribe software for clinical note transcription with the patient, who gave verbal consent to proceed.  History of Present Illness   The patient, with a history of sleep apnea and chronic  bronchitis, presents for a routine follow-up. He reports a slight worsening of his breathing, but denies any sudden events or illnesses such as the flu. His sleep apnea is well controlled with CPAP, with less than one breakthrough apnea an hour. He occasionally coughs, producing a small amount of phlegm. His last chest x-ray showed signs of bronchitis, but no pneumonia or other concerning findings. He used to get bronchitis frequently when he was younger, but it has been less frequent in recent years. He previously worked as a Solicitor. No intention of stopping smoking.     ROS-see HPI   + = positive Constitutional:    weight loss, night sweats, fevers, chills, fatigue, lassitude. HEENT:    headaches, difficulty swallowing, tooth/dental problems, sore throat,       sneezing, itching, ear ache, nasal congestion, post nasal drip, snoring CV:    chest pain, orthopnea, PND, swelling in lower extremities, anasarca,                                 dizziness, palpitations Resp:   +shortness of breath with exertion or at rest.                +productive cough,   non-productive cough, coughing up of blood.              change in color of mucus.  +wheezing.   Skin:    rash or lesions. GI:  No-   heartburn, indigestion, abdominal pain, nausea, vomiting, diarrhea,  change in bowel habits, loss of appetite GU: dysuria, change in color of urine, no urgency or frequency.   flank pain. MS:   joint pain, stiffness, decreased range of motion, back pain. Neuro-     nothing unusual Psych:  change in mood or affect.  depression or anxiety.   memory loss.      Objective:  OBJ- Physical Exam       General- Alert, Oriented, Affect-appropriate, Distress- none acute + Morbidly obese  Skin- rash-none, lesions- none, excoriation- none    Lymphadenopathy- none Head- atraumatic            Eyes- Gross vision intact, PERRLA, conjunctivae and secretions clear             Ears- Hearing, canals-normal            Nose- Clear, no-Septal dev, mucus, polyps, erosion, perforation             Throat- Mallampati III , mucosa clear , drainage- none, tonsils- atrophic Neck- flexible , trachea midline, no stridor , thyroid nl, carotid no bruit Chest - symmetrical excursion , unlabored           Heart/CV- RRR , no murmur , no gallop  , no rub, nl s1 s2                           - JVD- none , edema- none, stasis changes- none, varices- none           Lung-  wheeze+unlabored/ bilateral, cough+deep , dullness-none, rub- none           Chest wall-  Abd-  Br/ Gen/ Rectal- Not done, not indicated Extrem- cyanosis- none, clubbing, none, atrophy- none, strength- nl Neuro- grossly intact to observation  Assessment & Plan:  Assessment and Plan    Chronic Obstructive Pulmonary Disease (COPD) Mildly worse symptoms with occasional cough and phlegm production. Chest X-ray showed signs of bronchitis but no pneumonia. Patient continues to smoke 1.5-2 packs per day. Wheezing noted on examination. -Continue Trelegy inhaler, which patient reports as helpful. -Send prescription for Trelegy to [facility] and to manufacturer for assistance program. -Encourage smoking cessation.  Obstructive Sleep Apnea (OSA) Well controlled with CPAP use, less than one breakthrough apnea per hour. -Continue current CPAP settings.   Tobacco abuse -Stop smoking   Obesity - exercise/ diet  Follow-up in 1 year unless patient needs to be seen sooner.

## 2023-05-06 ENCOUNTER — Ambulatory Visit (INDEPENDENT_AMBULATORY_CARE_PROVIDER_SITE_OTHER): Payer: Medicare Other | Admitting: Internal Medicine

## 2023-05-06 ENCOUNTER — Telehealth: Payer: Self-pay | Admitting: *Deleted

## 2023-05-06 ENCOUNTER — Encounter: Payer: Self-pay | Admitting: Internal Medicine

## 2023-05-06 VITALS — BP 120/64 | HR 100 | Temp 98.2°F | Ht 71.0 in | Wt 336.4 lb

## 2023-05-06 DIAGNOSIS — G4733 Obstructive sleep apnea (adult) (pediatric): Secondary | ICD-10-CM

## 2023-05-06 MED ORDER — TRELEGY ELLIPTA 100-62.5-25 MCG/ACT IN AEPB: 1.0000 | INHALATION_SPRAY | Freq: Every day | RESPIRATORY_TRACT | 4 refills | Status: DC

## 2023-05-06 MED ORDER — TRELEGY ELLIPTA 100-62.5-25 MCG/ACT IN AEPB: 1.0000 | INHALATION_SPRAY | Freq: Every day | RESPIRATORY_TRACT | 12 refills | Status: DC

## 2023-05-06 MED ORDER — TRELEGY ELLIPTA 100-62.5-25 MCG/ACT IN AEPB: 1.0000 | INHALATION_SPRAY | Freq: Every day | RESPIRATORY_TRACT | Status: DC

## 2023-05-06 NOTE — Telephone Encounter (Signed)
Received notice from fax that line was busy on initial attempt.  Awaiting redial.

## 2023-05-06 NOTE — Patient Instructions (Signed)
Script for Trelegy refill sent to drug store  Script for Trelegy 100 printed for [place] to send to [facility] to renew your manufacturer's assistance  Order - sample Trelegy 100  inhale 1 puff then rinse mouth, once daily  We can continue CPAP auto 4-20

## 2023-05-06 NOTE — Telephone Encounter (Signed)
Initial fax failed, line busy.  Faxed again at 11:25 am

## 2023-05-06 NOTE — Telephone Encounter (Signed)
Prescription printed for Trelegy 100 by Dr. Maple Hudson, script was signed and faxed to [phone].

## 2023-05-27 ENCOUNTER — Telehealth: Payer: Self-pay | Admitting: Internal Medicine

## 2023-05-27 NOTE — Telephone Encounter (Signed)
PT calling for her husband asking for Dr. Roxy Cedar nurse. She states that she needs to get her husband on the [employer] list. Her # is [phone]  She is coming to [facility] on the 19th she'd like to come and have it signed. I adv her to leave it with the front desk. Get the name of who she left it with and be sure his chart is noted. We will put in Dr's box and mail/fax back for her.

## 2023-06-02 NOTE — Telephone Encounter (Signed)
F/u   [place] wife at the front desk to drop off [employer] papers   Place in MD box

## 2023-06-04 NOTE — Telephone Encounter (Signed)
Done-form in tray on C Pod

## 2023-06-04 NOTE — Telephone Encounter (Signed)
Form placed beside Dr Roxy Cedar computer in C pod to be signed. Thanks.

## 2023-06-07 NOTE — Telephone Encounter (Signed)
 Dr Maple Hudson- the pt's spouse says that there was another form with patient's signature on it for [employer]. Do you happen to still have this?

## 2023-06-07 NOTE — Telephone Encounter (Signed)
 Yes- its at my work station. I didn't realize there was more on the back.

## 2023-06-08 NOTE — Telephone Encounter (Signed)
 Forms faxed to [employer]  I made copies and placed in scan folder  I called and spoke with the pt's spouse and notified that this was done  Nothing further needed

## 2023-09-22 ENCOUNTER — Other Ambulatory Visit (HOSPITAL_COMMUNITY): Payer: Self-pay | Admitting: Gastroenterology

## 2023-09-22 DIAGNOSIS — K7460 Unspecified cirrhosis of liver: Secondary | ICD-10-CM

## 2023-10-11 ENCOUNTER — Ambulatory Visit (HOSPITAL_COMMUNITY)
Admission: RE | Admit: 2023-10-11 | Discharge: 2023-10-11 | Disposition: A | Discharge status: Home / Self Care | Source: Ambulatory Visit | Attending: Gastroenterology | Admitting: Gastroenterology

## 2023-10-11 DIAGNOSIS — K7460 Unspecified cirrhosis of liver: Secondary | ICD-10-CM | POA: Diagnosis present

## 2024-01-19 ENCOUNTER — Telehealth: Payer: Self-pay | Admitting: Internal Medicine

## 2024-01-19 MED ORDER — TRELEGY ELLIPTA 100-62.5-25 MCG/ACT IN AEPB: 1.0000 | INHALATION_SPRAY | Freq: Every day | RESPIRATORY_TRACT | 4 refills | Status: DC

## 2024-01-19 NOTE — Telephone Encounter (Signed)
 Copied from CRM #[id number]. Topic: Clinical - Medication Refill >> Jan 19, 2024  8:41 AM Joesph PARAS wrote: Medication: Fluticasone-Umeclidin-Vilant (TRELEGY ELLIPTA ) 100-62.5-25 MCG/ACT AEPB  Has the patient contacted their pharmacy? Yes   expired  This is the patient's preferred pharmacy:  [facility] per the patient's spouse  Is this the correct pharmacy for this prescription? Yes If no, delete pharmacy and type the correct one.   Has the prescription been filled recently? No  Is the patient out of the medication? Yes  Has the patient been seen for an appointment in the last year OR does the patient have an upcoming appointment? Yes  Can we respond through MyChart? No - Patient is requesting a phone call to notify when it has been sent to [facility].   Agent: Please be advised that Rx refills may take up to 3 business days. We ask that you follow-up with your pharmacy.

## 2024-01-20 ENCOUNTER — Telehealth: Payer: Self-pay

## 2024-01-20 ENCOUNTER — Other Ambulatory Visit: Payer: Self-pay | Admitting: *Deleted

## 2024-01-20 ENCOUNTER — Ambulatory Visit: Payer: Self-pay | Admitting: Internal Medicine

## 2024-01-20 MED ORDER — TRELEGY ELLIPTA 100-62.5-25 MCG/ACT IN AEPB: 1.0000 | INHALATION_SPRAY | Freq: Every day | RESPIRATORY_TRACT | 4 refills | Status: DC

## 2024-01-20 NOTE — Telephone Encounter (Signed)
 Copied from CRM [id number]. Topic: Clinical - Prescription Issue >> Jan 20, 2024 10:25 AM Rilla NOVAK wrote: Reason for CRM: Patient's wife has called several times to resolve prescription issue.  Please call patient [phone].  This has been handled. NFN

## 2024-01-20 NOTE — Telephone Encounter (Signed)
 I called and spoke with patient's wife, [place] (HAWAII), I explained that I have printed out a new prescription and once Dr. Neysa signs it I will fax it to [facility].  She verbalized understanding.

## 2024-01-20 NOTE — Telephone Encounter (Signed)
 Wife Mrs Gosselin calling again.     This time she seems upset this has not been done.  She says he has approval, [facility] neds new Rx from the dr

## 2024-01-20 NOTE — Telephone Encounter (Signed)
 New prescription printed for Dr. Saundra signature.  Dr. Neysa, I placed a prescription for this patient on your desk that needs your signature.  Thank you.

## 2024-01-20 NOTE — Progress Notes (Unsigned)
 I spoke with patient's wife [place]), DPR, I advised her that I had printed a new prescription for the trelegy for Dr. Neysa to sign and once I received it back I would fax it to [facility].  She verbalized understanding.  Signed prescription received and faxed to [facility] [phone].

## 2024-01-20 NOTE — Telephone Encounter (Signed)
 Copied from CRM #[id number]. Topic: Clinical - Medication Refill >> Jan 19, 2024  8:41 AM Joesph PARAS wrote: Medication: Fluticasone-Umeclidin-Vilant (TRELEGY ELLIPTA ) 100-62.5-25 MCG/ACT AEPB  Has the patient contacted their pharmacy? Yes   expired  This is the patient's preferred pharmacy:  [facility] per the patient's spouse  Is this the correct pharmacy for this prescription? Yes If no, delete pharmacy and type the correct one.   Has the prescription been filled recently? No  Is the patient out of the medication? Yes  Has the patient been seen for an appointment in the last year OR does the patient have an upcoming appointment? Yes  Can we respond through MyChart? No - Patient is requesting a phone call to notify when it has been sent to [facility].   Agent: Please be advised that Rx refills may take up to 3 business days. We ask that you follow-up with your pharmacy. >> Jan 19, 2024  4:03 PM Corean SAUNDERS wrote: Patients wife [place]  states the prescription was sent to the drug store but was needed to be sent to [facility] patient assistance. [place] is requesting this be resolved and that a nurse call her.

## 2024-01-20 NOTE — Telephone Encounter (Signed)
 New prescription for Trelegy signed and faxed to [facility] [phone].

## 2024-01-20 NOTE — Telephone Encounter (Signed)
 Patient's wife requesting Trelegy refill be sent to [facility] patient assistance. Prescription was sent to [facility] and wife is needing it resent to [facility]. Would like a call to let her know this was sent to [facility].   FYI Only or Action Required?: Action required by provider: medication refill request.  Patient is followed in Pulmonology for OSA, COPD, last seen on 05/06/2023 by Neysa Reggy BIRCH, MD.  Called Nurse Triage reporting Advice Only.  Triage Disposition: Call PCP When Office is Open  Patient/caregiver understands and will follow disposition?: Yes Copied from CRM [id number]. Topic: Clinical - Prescription Issue >> Jan 20, 2024  3:03 PM Nathanel DEL wrote: Reason for CRM: day 2 pt's Rx has not been handled.  Was supposed to gp to [facility], but went to [facility]. The Mrs is noticeably upset. I believe she may be better handled if a nurse could speak with her.   And I thank you Reason for Disposition  [1] Caller requesting NON-URGENT health information AND [2] PCP's office is the best resource  Answer Assessment - Initial Assessment Questions 1. REASON FOR CALL: What is the main reason for your call? or How can I best help you?     Patient's wife calling about Trelegy refill. Patient gets Trelegy throught [facility] but refill was sent to [facility]. Wife is asking for the refill to be sent to [facility] today. Wife is asking for a follow up call to let her know it was sent 2. SYMPTOMS : Do you have any symptoms?      no 3. OTHER QUESTIONS: Do you have any other questions?     no  Protocols used: Information Only Call - No Triage-A-AH

## 2024-01-21 NOTE — Telephone Encounter (Signed)
 Copied from CRM #[id number]. Topic: Clinical - Medication Refill >> Jan 19, 2024  8:41 AM Joesph PARAS wrote: Medication: Fluticasone-Umeclidin-Vilant (TRELEGY ELLIPTA ) 100-62.5-25 MCG/ACT AEPB  Has the patient contacted their pharmacy? Yes   expired  This is the patient's preferred pharmacy:  [facility] per the patient's spouse  Is this the correct pharmacy for this prescription? Yes If no, delete pharmacy and type the correct one.   Has the prescription been filled recently? No  Is the patient out of the medication? Yes  Has the patient been seen for an appointment in the last year OR does the patient have an upcoming appointment? Yes  Can we respond through MyChart? No - Patient is requesting a phone call to notify when it has been sent to [facility].   Agent: Please be advised that Rx refills may take up to 3 business days. We ask that you follow-up with your pharmacy. >> Jan 19, 2024  4:03 PM Corean SAUNDERS wrote: Patients wife [place]  states the prescription was sent to the drug store but was needed to be sent to [facility] patient assistance. [place] is requesting this be resolved and that a nurse call her.    DONE

## 2024-01-24 ENCOUNTER — Telehealth: Payer: Self-pay

## 2024-01-24 ENCOUNTER — Other Ambulatory Visit: Payer: Self-pay

## 2024-01-24 MED ORDER — TRELEGY ELLIPTA 100-62.5-25 MCG/ACT IN AEPB: INHALATION_SPRAY | RESPIRATORY_TRACT | 3 refills | Status: DC

## 2024-01-24 NOTE — Telephone Encounter (Signed)
 Spoke w/ PT and wife resent Rx to [facility]    - NFN    Copied from CRM [id number]. Topic: Clinical - Prescription Issue >> Jan 20, 2024  3:03 PM Nathanel DEL wrote: Reason for CRM: day 2 pt's Rx has not been handled.  Was supposed to gp to [facility], but went to [facility]. The Mrs is noticeably upset. I believe she may be better handled if a nurse could speak with her.   And I thank you >> Jan 21, 2024  8:18 AM Leila C wrote: Patient's spouse [place] [phone] states has been trying to get refill Fluticasone-Umeclidin-Vilant (TRELEGY ELLIPTA ) 100-62.5-25 MCG/ACT AEPB   to [facility]. Informed, [place] the office signed and faxed to [facility] 01/20/24. Sudan states just spoke with [facility] today and they have not received the refill. Please advise, refax refill and call back.

## 2024-01-25 ENCOUNTER — Telehealth: Payer: Self-pay

## 2024-01-25 NOTE — Telephone Encounter (Signed)
 Copied from CRM #[id number]. Topic: Clinical - Prescription Issue >> Jan 25, 2024 11:53 AM [name] wrote: Reason for CRM: Pt's spouse Andrew Mercer on [place] is calling to have his prescription of Fluticasone-Umeclidin-Vilant (TRELEGY ELLIPTA ) 100-62.5-25 MCG/ACT AEPB sent to the [facility]. I couldn't locate a fax number, but I did see that the number to call to have it faxed to [facility] is [phone].   Pt's phone number is [phone] ok to leave a vm. Andrew Mercer is requesting a call to confirm once it is sent over to [facility]. >> Jan 25, 2024 12:25 PM Ismael A wrote: Patient's wife called back with correct fax #: [phone] for [facility] - she is requesting a call back as soon as prescription is sent out ph# [phone]

## 2024-01-26 ENCOUNTER — Telehealth: Payer: Self-pay | Admitting: *Deleted

## 2024-01-26 ENCOUNTER — Other Ambulatory Visit: Payer: Self-pay

## 2024-01-26 MED ORDER — TRELEGY ELLIPTA 100-62.5-25 MCG/ACT IN AEPB: INHALATION_SPRAY | RESPIRATORY_TRACT | 3 refills | Status: DC

## 2024-01-26 NOTE — Telephone Encounter (Signed)
 Copied from CRM [id number]. Topic: Clinical - Prescription Issue >> Jan 24, 2024  8:29 AM Andrew Mercer wrote: Reason for CRM: Spouse states she spoke with [facility] & they did not receive the prescription for Trelegy - requested for it to be re-faxed and Mercer callback once completed.   Callback number: [phone] >> Jan 26, 2024  8:26 AM Andrew Mercer wrote: Patient's wife, Andrew Mercer, calling in upset stating the prescription has been sent to the wrong location and she has called in multiple times - she state the patient only has Mercer few days supply left - she is requesting to have this sent to [facility], she states [facility] is requesting to have the following added to cover sheet: his ID number [id number], 90 day supply 3 refills, and hand signed by doctor - please call her back, she is very adamant about getting Mercer call back [phone] (H)  >> Jan 24, 2024  1:27 PM Andrew Mercer wrote: Patient's spouse calling to state that pharmacy still has not received prescription. Please sent to Fax: [phone].  I printed the rx, filled out fax cover sheet with pt ID on it- All we need is Dr Saundra signature and to fax it over. I spoke with the pt's spouse and notified that this will be faxed in the am. Dr Neysa please sign and give to clinical to fax it, thanks!

## 2024-01-26 NOTE — Progress Notes (Signed)
 REORDERED DUE TO NEEDING TO SEND IT TO [facility]

## 2024-01-26 NOTE — Telephone Encounter (Signed)
 Happy to sign, but I don't see something pending for me to sign. Please help me take care of this. We can also offer Mrs Vantrease 2 samples of Trelegy to use while they await response fro drug company. Thanks. -CDY

## 2024-01-27 NOTE — Telephone Encounter (Signed)
 Rx was signed and faxed to [facility].

## 2024-02-14 ENCOUNTER — Telehealth: Payer: Self-pay

## 2024-02-14 NOTE — Telephone Encounter (Signed)
 Copied from CRM #[id number]. Topic: Appointments - Transfer of Care >> Feb 14, 2024 10:02 AM Ismael A wrote: Pt is requesting to transfer FROM: Dr. Neysa Pt is requesting to transfer TO: Tammy Parrett Reason for requested transfer: Dr. Neysa retiring It is the responsibility of the team the patient would like to transfer to (NP Tammy Parrett) to reach out to the patient if for any reason this transfer is not acceptable.   -NFN

## 2024-03-30 ENCOUNTER — Other Ambulatory Visit (HOSPITAL_COMMUNITY): Payer: Self-pay | Admitting: Gastroenterology

## 2024-03-30 DIAGNOSIS — K7460 Unspecified cirrhosis of liver: Secondary | ICD-10-CM

## 2024-04-07 ENCOUNTER — Ambulatory Visit (HOSPITAL_COMMUNITY)
Admission: RE | Admit: 2024-04-07 | Discharge: 2024-04-07 | Disposition: A | Discharge status: Home / Self Care | Source: Ambulatory Visit | Attending: Gastroenterology | Admitting: Gastroenterology

## 2024-04-07 DIAGNOSIS — K7460 Unspecified cirrhosis of liver: Secondary | ICD-10-CM | POA: Insufficient documentation

## 2024-04-07 IMAGING — DX DG CHEST 2V
2 series · 2 of 2 positions shown · non-contrast
Comparison: Previous studies including the examination of
02/28/2021

CLINICAL DATA: Abnormal chest x-ray

EXAM:
CHEST - 2 VIEW

[chest pa]
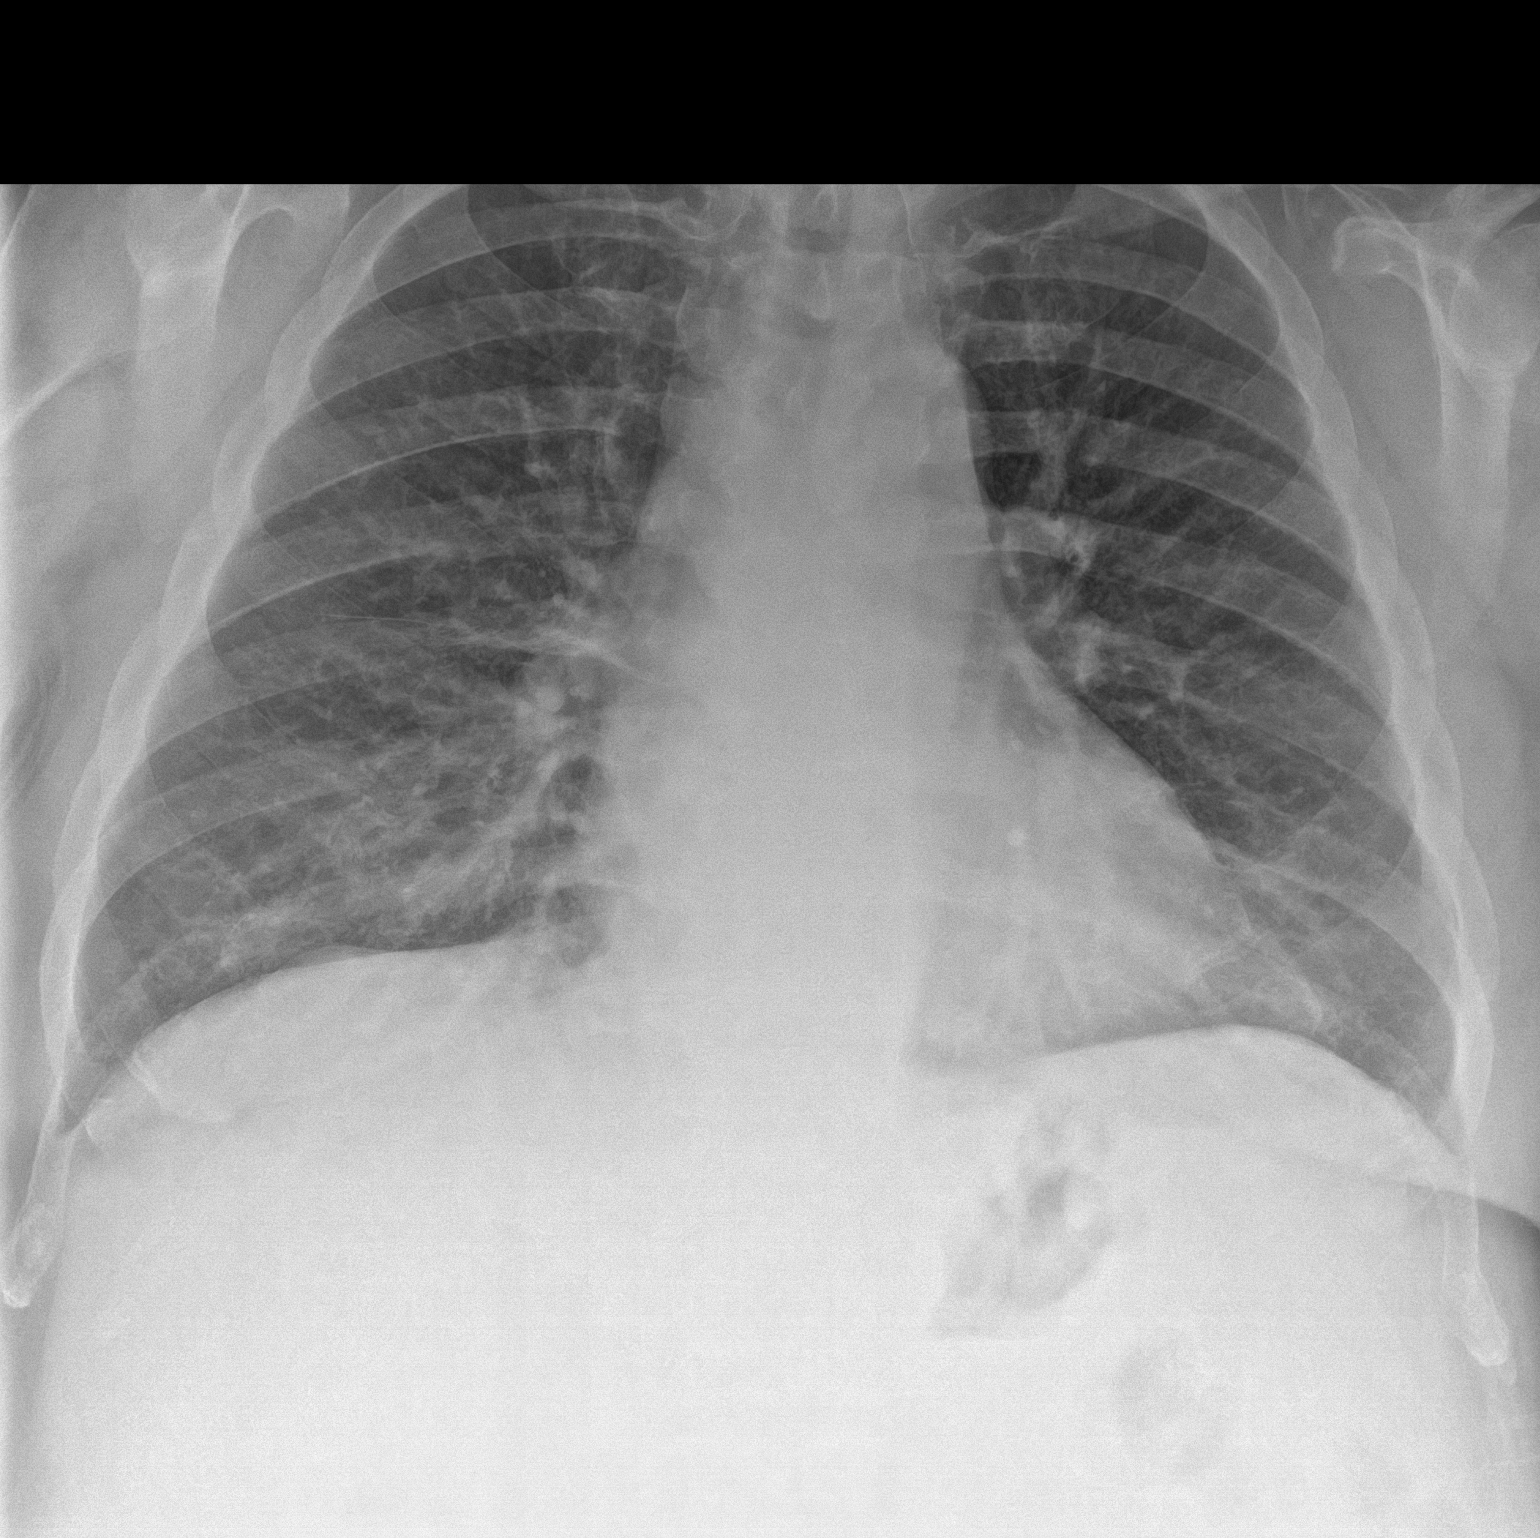

[chest lat]
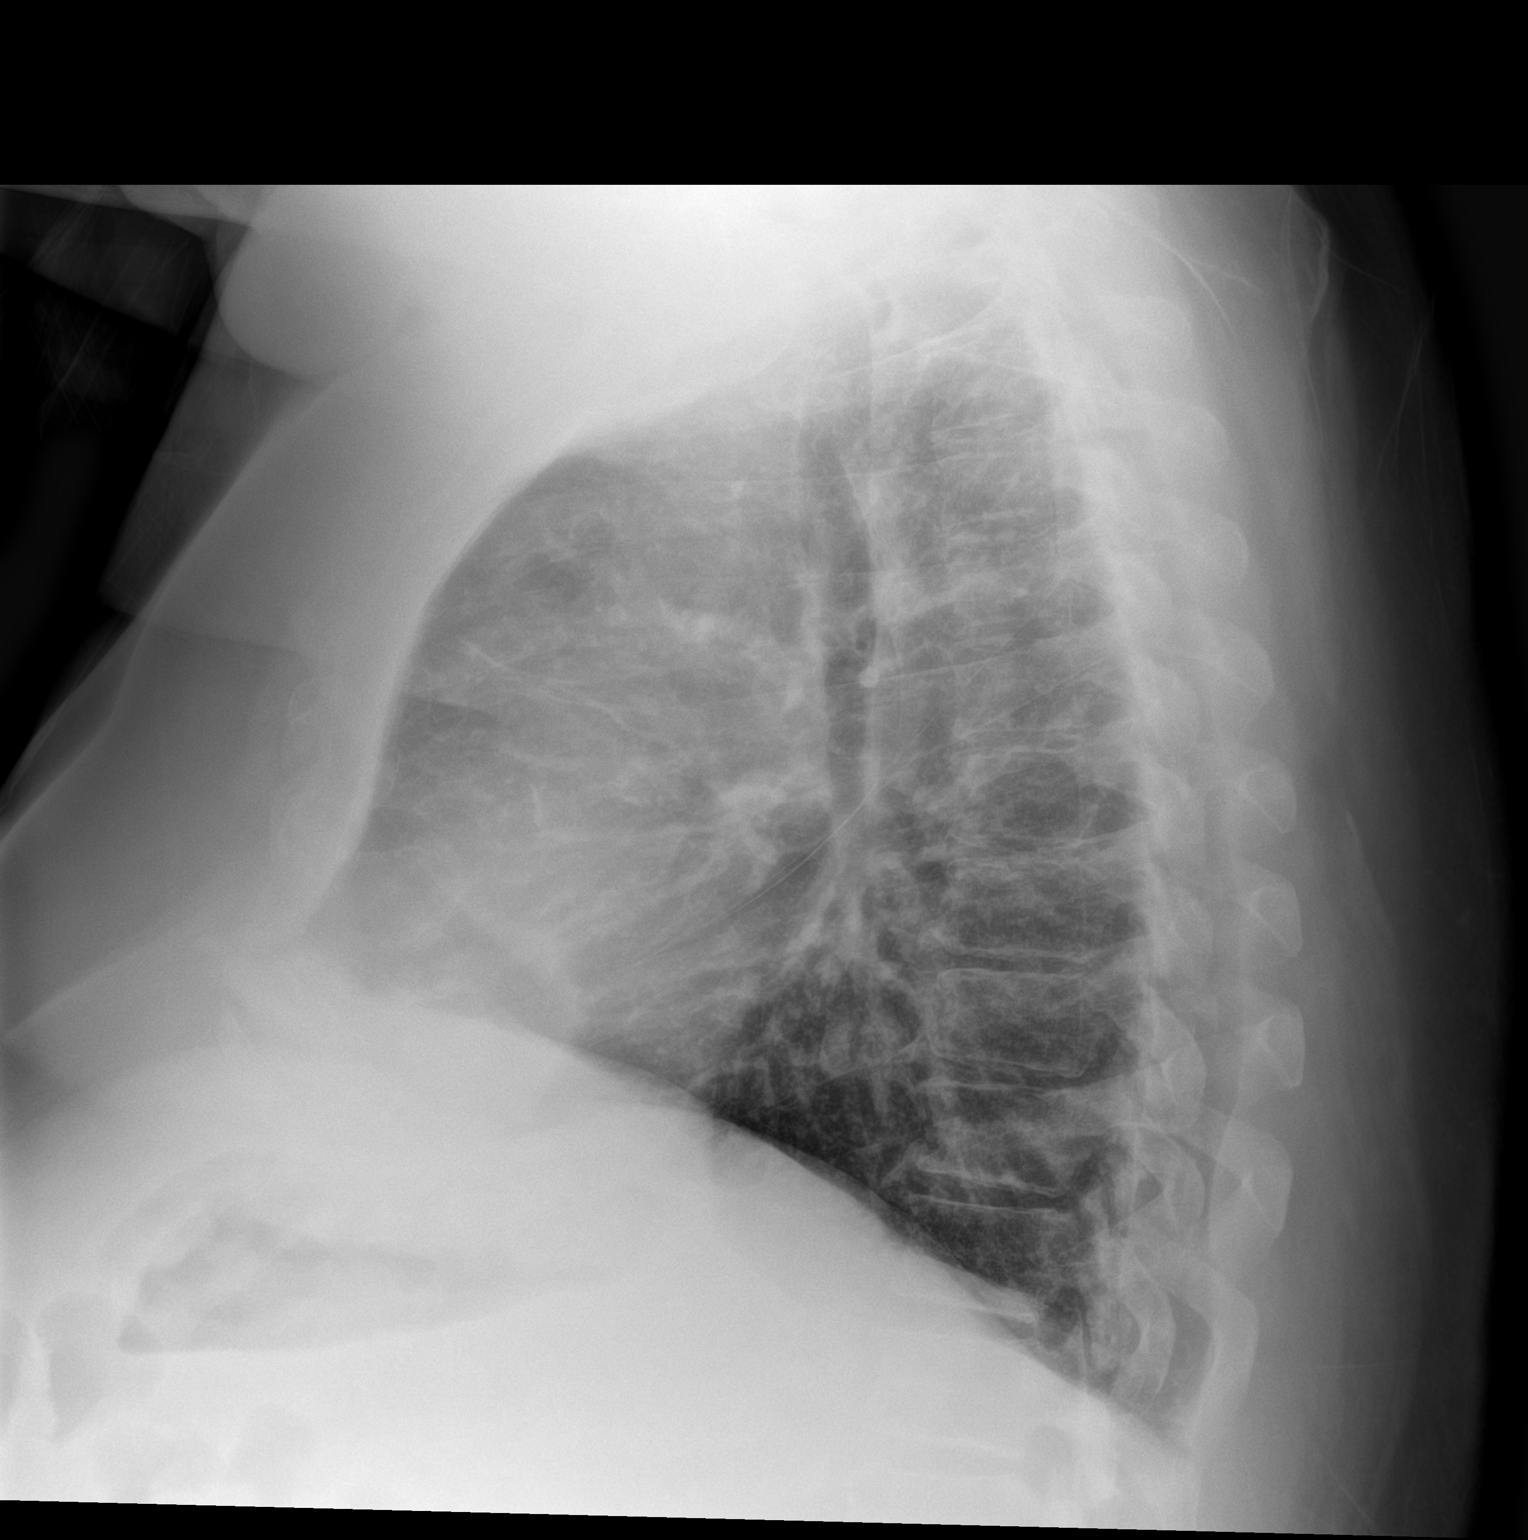

[2 of 2 positions shown; findings below may reference images not displayed]

FINDINGS: Cardiac size is within normal limits. There are no signs of alveolar
pulmonary edema. There is interval improvement in aeration of right
lower lung fields. There are no new infiltrates or signs of
pulmonary edema. There is no pleural effusion or pneumothorax.
IMPRESSION: There is interval improvement in aeration of right lower lung
fields, possibly suggesting resolution of pneumonia. There are no
signs of pulmonary edema or focal pulmonary consolidation in the
current study.

## 2024-05-05 ENCOUNTER — Telehealth: Payer: Self-pay

## 2024-05-05 ENCOUNTER — Ambulatory Visit: Admitting: Adult Health

## 2024-05-05 ENCOUNTER — Ambulatory Visit: Payer: Medicare Other | Admitting: Internal Medicine

## 2024-05-05 ENCOUNTER — Encounter: Payer: Self-pay | Admitting: Adult Health

## 2024-05-05 VITALS — BP 130/78 | HR 64 | Ht 71.0 in | Wt 340.8 lb

## 2024-05-05 DIAGNOSIS — G4733 Obstructive sleep apnea (adult) (pediatric): Secondary | ICD-10-CM

## 2024-05-05 DIAGNOSIS — F17210 Nicotine dependence, cigarettes, uncomplicated: Secondary | ICD-10-CM

## 2024-05-05 DIAGNOSIS — J449 Chronic obstructive pulmonary disease, unspecified: Secondary | ICD-10-CM | POA: Diagnosis not present

## 2024-05-05 DIAGNOSIS — F17200 Nicotine dependence, unspecified, uncomplicated: Secondary | ICD-10-CM

## 2024-05-05 DIAGNOSIS — Z6841 Body Mass Index (BMI) 40.0 and over, adult: Secondary | ICD-10-CM

## 2024-05-05 MED ORDER — TRELEGY ELLIPTA 100-62.5-25 MCG/ACT IN AEPB: 1.0000 | INHALATION_SPRAY | Freq: Every day | RESPIRATORY_TRACT | Status: AC

## 2024-05-05 MED ORDER — TRELEGY ELLIPTA 100-62.5-25 MCG/ACT IN AEPB: INHALATION_SPRAY | RESPIRATORY_TRACT | 3 refills | Status: DC

## 2024-05-05 MED ORDER — TRELEGY ELLIPTA 100-62.5-25 MCG/ACT IN AEPB: INHALATION_SPRAY | RESPIRATORY_TRACT | 3 refills | Status: AC

## 2024-05-05 NOTE — Telephone Encounter (Signed)
 Patient provided [facility] Patient Assistance Paperwork at OV on 05/05/24. Faxed the documents to [facility] at patient's request. Fax confirmation received.  Once copy was made of the paperwork. The original was given back to the pt, and the copy was faxed to [facility].   Notified pt via MyChart.

## 2024-05-05 NOTE — Telephone Encounter (Signed)
 ATC, unable to LVM  AVS didn't print with all of the information from today's OV. Called to let pt know that we would be mailing it to him. AVS placed in outgoing mail, NFN.

## 2024-05-05 NOTE — Patient Instructions (Addendum)
 Continue on CPAP At bedtime, wear all night long  Work on healthy weight loss  Do not drive if sleepy  Order for new CPAP sent to your DME   Continue on Trelegy 1 puff daily  Refer to Lung cancer screening program for yearly CT chest  Work on not smoking   Follow up with Dr. Olena or Letia Guidry NP in 6 months and As needed

## 2024-05-05 NOTE — Progress Notes (Signed)
 "  @Patient  ID: Andrew Mercer, male    DOB: 03/08/1951, 74 y.o.   MRN: [id number]  Chief Complaint  Patient presents with   Obstructive Sleep Apnea    F/u    Referring provider: Tanda Prentice DEL, MD  HPI: 74 yo male smoker followed for obstructive sleep apnea, and COPD/chronic bronchitis  Medical history significant for Psoriatic Arthritis, DM2,    TEST/EVENTS : Reviewed 05/05/2024  NPSG 2004, AHI 9/ hr Office Spirometry 07/13/17-moderate obstruction, moderate restriction of exhaled volume likely reflecting both air trapping and obesity hypoventilation.  FVC 2.40/51%, FEV1 1.83/53%, ratio 0.76, FEF 25-75%, 1.56/ 59% PFT 04/04/21- moderate obstruction with no resp to BD.   Discussed the use of AI scribe software for clinical note transcription with the patient, who gave verbal consent to proceed.  History of Present Illness Andrew Mercer is a 74 year old male with COPD with chronic bronchitis and obstructive sleep apnea who presents for follow-up.  Former patient of Dr. Saundra last seen in the office in January 2025.  He has a long-standing history of COPD and chronic bronchitis, continuing to smoke two packs of cigarettes daily since junior high. He reports his breathing is 'as good as it's going to get' and experiences some shortness of breath. He is not on home oxygen therapy. He reports that he spends most of his time sitting, watching television, and using the computer.  Patient has no significant shortness of breath at rest.  But does admit that he is limited by his breathing and therefore is not very active.  He uses a Trelegy inhaler daily and requests a refill. He has paperwork for patient assistance for [facility] program that offers assistance after a $600 expenditure.  He has sleep apnea, which is well-managed with CPAP therapy. He uses a nasal mask and maintains it by cleaning with baby wipes daily. He has received notifications for a new CPAP machine and is continuing with the  current settings.  Says he benefits from CPAP.  Download shows 100% compliance with daily average usage at 8.5 hours.  He is on auto CPAP 4 to 20 cm H2O.  AHI 0.6/hour with daily average pressure around 16.9 cm H2O.  Patient says his machine is getting old and needs a order for new CPAP machine.  He has a history of psoriatic arthritis and has tried various treatments, including  injections, which provide only temporary relief.       Allergies[1]  Immunization History  Administered Date(s) Administered   Fluad Quad(high Dose 65+) 12/07/2018, 01/21/2021, 02/11/2023   INFLUENZA, HIGH DOSE SEASONAL PF 01/08/2016, 01/27/2017, 01/13/2018, 01/13/2018, 01/12/2020, 01/21/2021   Influenza Split 02/08/2014, 01/12/2016   Influenza,inj,Quad PF,6+ Mos 01/12/2015   Influenza-Unspecified 01/12/2015, 12/27/2019, 01/06/2022   PFIZER Comirnaty(Gray Top)Covid-19 Tri-Sucrose Vaccine 07/27/2020, 02/11/2023   PFIZER(Purple Top)SARS-COV-2 Vaccination 05/08/2019, 05/29/2019, 12/06/2019, 02/12/2020   PNEUMOCOCCAL CONJUGATE-20 01/06/2022   Pfizer Covid-19 Vaccine Bivalent Booster 25yrs & up 01/15/2021   Pfizer(Comirnaty)Fall Seasonal Vaccine 12 years and older 01/19/2022   Pneumococcal Conjugate-13 01/12/2015, 02/18/2015   Pneumococcal Polysaccharide-23 06/10/2012, 04/22/2017   Respiratory Syncytial Virus Vaccine,Recomb Aduvanted(Arexvy) 01/06/2022   Td 05/14/2016   Tdap 06/15/2006   Zoster Recombinant(Shingrix) 11/26/2019, 02/19/2020   Zoster, Live 12/08/2013    Past Medical History:  Diagnosis Date   COPD (chronic obstructive pulmonary disease) (HCC)    Diabetes (HCC)    TYPE II   GERD (gastroesophageal reflux disease)    Gout    HTN (hypertension)    Hyperlipidemia  Seasonal allergies    Sleep apnea    uses a cpap   Wears dentures    top   Wears glasses    reading    Tobacco History: Tobacco Use History[2] Ready to quit: Not Answered Counseling given: Not Answered Tobacco comments:  started at age 52, as of 08/29/2021 patient smokes 2 packs a day.  Not trying to quit.  05/06/2023 hfb   Outpatient Medications Prior to Visit  Medication Sig Dispense Refill   allopurinol (ZYLOPRIM) 100 MG tablet Take by mouth.     amLODipine (NORVASC) 10 MG tablet Take 10 mg by mouth daily.     aspirin 325 MG tablet 1 tablet     Blood Glucose Monitoring Suppl (TGT BLOOD GLUCOSE MONITORING) w/Device KIT One Touch Verio Flex monitor  Diagnoses Code : E11.8     ezetimibe (ZETIA) 10 MG tablet Take 10 mg by mouth daily.     fenofibrate 160 MG tablet Take 160 mg by mouth daily.     Fluticasone-Umeclidin-Vilant (TRELEGY ELLIPTA ) 100-62.5-25 MCG/ACT AEPB Inhale 1 puff then rinse mouth, once daily 180 each 3   glucose blood test strip Use as instructed 4x daily One Touch ultra 150 each 5   insulin  lispro (HUMALOG) 100 UNIT/ML KiwkPen Inject 20 Units into the skin 3 (three) times daily. (Patient taking differently: Inject 10 Units into the skin 3 (three) times daily.)     losartan-hydrochlorothiazide (HYZAAR) 100-12.5 MG tablet Take 1 tablet by mouth daily.     Multiple Vitamin (MULTI VITAMIN) TABS 1 tablet Orally Once a day for 30 day(s)     omega-3 acid ethyl esters (LOVAZA) 1 g capsule Take 4 g by mouth daily.     pantoprazole (PROTONIX) 40 MG tablet Take 40 mg by mouth daily.     RAYA SURE PEN NEEDLE 31G X 6 MM MISC USE 1 PEN NEEDLE FIVE TIMES DAILY     TOUJEO  SOLOSTAR 300 UNIT/ML SOPN Inject 75 Units into the skin 2 (two) times daily.     triamcinolone ointment (KENALOG) 0.1 % APPLY OINTMENT SPARINGLY TOPICALLY TO AFFECTED SKIN THREE TIMES DAILY     Fluticasone-Umeclidin-Vilant (TRELEGY ELLIPTA ) 100-62.5-25 MCG/ACT AEPB Inhale 1 puff into the lungs daily. (Patient not taking: Reported on 05/05/2024)     Fluticasone-Umeclidin-Vilant (TRELEGY ELLIPTA ) 100-62.5-25 MCG/ACT AEPB Inhale 1 puff into the lungs daily. (Patient not taking: Reported on 05/05/2024) 3 each 4   Semaglutide, 1 MG/DOSE, (OZEMPIC, 1  MG/DOSE,) 4 MG/3ML SOPN inject 1 mg subcutaneously once a week (Patient not taking: Reported on 05/05/2024)     No facility-administered medications prior to visit.     Review of Systems:   Constitutional:   No  weight loss, night sweats,  Fevers, chills, +fatigue, or  lassitude.  HEENT:   No headaches,  Difficulty swallowing,  Tooth/dental problems, or  Sore throat,                No sneezing, itching, ear ache, nasal congestion, post nasal drip,   CV:  No chest pain,  Orthopnea, PND, swelling in lower extremities, anasarca, dizziness, palpitations, syncope.   GI  No heartburn, indigestion, abdominal pain, nausea, vomiting, diarrhea, change in bowel habits, loss of appetite, bloody stools.   Resp: .  No chest wall deformity  Skin: no rash or lesions.  GU: no dysuria, change in color of urine, no urgency or frequency.  No flank pain, no hematuria   MS:  No joint pain or swelling.  No decreased range of  motion.  No back pain.    Physical Exam  BP 130/78   Pulse 64   Ht 5' 11 (1.803 m) Comment: Per pt  Wt (!) 340 lb 12.8 oz (154.6 kg)   SpO2 95% Comment: RA  BMI 47.53 kg/m   GEN: A/Ox3; pleasant , NAD, well nourished    HEENT:  [place]/AT,   NOSE-clear, THROAT-clear, no lesions, no postnasal drip or exudate noted.  Class III NP airway  NECK:  Supple w/ fair ROM; no JVD; normal carotid impulses w/o bruits; no thyromegaly or nodules palpated; no lymphadenopathy.    RESP few trace rhonchi  no accessory muscle use, no dullness to percussion  CARD:  RRR, no m/r/g, 1+ peripheral edema, pulses intact, no cyanosis or clubbing.  GI:   Soft & nt; nml bowel sounds; no organomegaly or masses detected.   Musco: Warm bil, no deformities or joint swelling noted.   Neuro: alert, no focal deficits noted.    Skin: Warm, no lesions or rashes    Lab Results:Reviewed 05/05/2024   CBC    Component Value Date/Time   WBC 7.6 07/12/2009 1500   RBC 4.98 07/12/2009 1500   HGB 15.3  09/27/2013 0842   HCT 45.0 09/27/2013 0842   PLT 188 07/12/2009 1500   MCV 89.2 07/12/2009 1500   MCHC 33.3 07/12/2009 1500   RDW 13.8 07/12/2009 1500   LYMPHSABS 2.6 07/12/2009 1500   MONOABS 0.6 07/12/2009 1500   EOSABS 0.2 07/12/2009 1500   BASOSABS 0.1 07/12/2009 1500    BMET    Component Value Date/Time   NA 135 03/17/2023 0841   K 5.3 03/17/2023 0841   CL 101 03/17/2023 0841   CO2 26 03/17/2023 0841   GLUCOSE 158 (H) 03/17/2023 0841   BUN 18 03/17/2023 0841   CREATININE 1.08 03/17/2023 0841   CALCIUM 9.3 03/17/2023 0841   GFRNONAA >60 07/12/2009 1500   GFRAA  07/12/2009 1500    >60        The eGFR has been calculated using the MDRD equation. This calculation has not been validated in all clinical situations. eGFR's persistently <60 mL/min signify possible Chronic Kidney Disease.    BNP No results found for: BNP  ProBNP No results found for: PROBNP  Imaging:   Administration History     None          Latest Ref Rng & Units 04/04/2021    9:13 AM  PFT Results  FVC-Pre L 2.64   FVC-Predicted Pre % 62   FVC-Post L 2.64   FVC-Predicted Post % 62   Pre FEV1/FVC % % 74   Post FEV1/FCV % % 77   FEV1-Pre L 1.97   FEV1-Predicted Pre % 63   FEV1-Post L 2.03   DLCO uncorrected ml/min/mmHg 21.30   DLCO UNC% % 85   DLCO corrected ml/min/mmHg 21.30   DLCO COR %Predicted % 85   DLVA Predicted % 103   TLC L 5.57   TLC % Predicted % 81   RV % Predicted % 111     No results found for: NITRICOXIDE      No data to display              Assessment & Plan:   Assessment and Plan Assessment & Plan Obstructive sleep apnea  -excellent control on CPAP with perceived benefit. His obstructive sleep apnea is well-controlled with CPAP therapy. He uses the CPAP machine nightly with excellent compliance and no apneic events. A new CPAP  machine has been ordered with current settings. He was advised on CPAP maintenance, including cleaning the mask and  chamber.  Chronic obstructive pulmonary disease  -appears to be stable. He has chronic bronchitis and continues to smoke two packs per day, showing no interest in quitting. The benefits of smoking cessation and potential for lung cancer screening were discussed. He is a candidate for a lung cancer screening program involving annual CT scans to detect early lung nodules. He has no history of cancer in the last five years and is a good candidate for the program. He was referred to the lung cancer screening program for annual CT scans. Trelegy inhaler refills were sent to the pharmacy. He was advised on smoking cessation and offered support if interested, encouraged to increase activity and lose weight, and advised to rinse his mouth after using Trelegy.  Morbid obesity with BMI 47.  We discussed healthy weight management.  Previously tried Ozempic for his diabetes but had no perceived benefit and reports he did not lose any weight.  Tobacco abuse.  We discussed smoking cessation.  Referral to the lung cancer CT screening program.  Plan  Patient Instructions  Continue on CPAP At bedtime, wear all night long  Work on healthy weight loss  Do not drive if sleepy  Order for new CPAP sent to your DME   Continue on Trelegy 1 puff daily  Refer to Lung cancer screening program for yearly CT chest  Work on not smoking   Follow up with Dr. Olena or Daniesha Driver NP in 6 months and As needed              Madelin Stank, NP 05/05/2024  I spent   minutes dedicated to the care of this patient on the date of this encounter to include pre-visit review of records, face-to-face time with the patient discussing conditions above, post visit ordering of testing, clinical documentation with the electronic health record, making appropriate referrals as documented, and communicating necessary findings to members of the patients care team.      [1]  Allergies Allergen Reactions   Shellfish Allergy Anaphylaxis     ONLY CLAMS   Statins     Leg cramps/liver problems   Clarithromycin     Other Reaction(s): Unknown   Clindamycin/Lincomycin     Adverse effects   Dulaglutide Nausea Only   Lisinopril     Other reaction(s): Unknown  [2]  Social History Tobacco Use  Smoking Status Every Day   Current packs/day: 1.50   Average packs/day: 1.5 packs/day for 60.1 years (90.1 ttl pk-yrs)   Types: Cigarettes   Start date: 04/13/1964   Passive exposure: Never  Smokeless Tobacco Never  Tobacco Comments   started at age 32, as of 08/29/2021 patient smokes 2 packs a day.  Not trying to quit.  05/06/2023 hfb   "

## 2024-05-12 ENCOUNTER — Telehealth: Payer: Self-pay | Admitting: Acute Care

## 2024-05-12 ENCOUNTER — Telehealth: Payer: Self-pay

## 2024-05-12 DIAGNOSIS — Z87891 Personal history of nicotine dependence: Secondary | ICD-10-CM

## 2024-05-12 DIAGNOSIS — F17210 Nicotine dependence, cigarettes, uncomplicated: Secondary | ICD-10-CM

## 2024-05-12 DIAGNOSIS — Z122 Encounter for screening for malignant neoplasm of respiratory organs: Secondary | ICD-10-CM

## 2024-05-12 NOTE — Telephone Encounter (Signed)
 Lung Cancer Screening Narrative/Criteria Questionnaire (Cigarette Smokers Only- No Cigars/Pipes/vapes)   Andrew Mercer   SDMV:05/17/24@0915a [place]                                           [birth year]               LDCT: 05/31/24@0830a [place]    75 y.o.   Phone: [phone]  Lung Screening Narrative (confirm age 49-77 yrs Medicare / 50-80 yrs Private pay insurance)   Insurance information:Medicare Andrew Mercer   Referring Provider:Parrett   This screening involves an initial phone call with a team member from our program. It is called a shared decision making visit. The initial meeting is required by insurance and Medicare to make sure you understand the program. This appointment takes about 15-20 minutes to complete. The CT scan will completed at a separate date/time. This scan takes about 5-10 minutes to complete and you may eat and drink before and after the scan.  Criteria questions for Lung Cancer Screening:   Are you a current or former smoker? Current Age began smoking: 13y   If you are a former smoker, what year did you quit smoking? NA   To calculate your smoking history, I need an accurate estimate of how many packs of cigarettes you smoked per day and for how many years. (Not just the number of PPD you are now smoking)   Years smoking 61 x Packs per day 1-2p = Pack years 91   (at least 20 pack yrs)   (Make sure they understand that we need to know how much they have smoked in the past, not just the number of PPD they are smoking now)  Do you have a personal history of cancer?  No    Do you have a family history of cancer? No  Are you coughing up blood?  No  Have you had unexplained weight loss of 15 lbs or more in the last 6 months? No  It looks like you meet all criteria.     Additional information: N/A

## 2024-05-12 NOTE — Telephone Encounter (Signed)
 Error

## 2024-05-17 ENCOUNTER — Encounter: Payer: Self-pay | Admitting: Adult Health

## 2024-05-17 ENCOUNTER — Ambulatory Visit: Admitting: Adult Health

## 2024-05-17 DIAGNOSIS — F17210 Nicotine dependence, cigarettes, uncomplicated: Secondary | ICD-10-CM

## 2024-05-17 NOTE — Progress Notes (Signed)
" °  Virtual Visit via Telephone Note  I connected with Andrew Mercer , 05/17/24 9:17 AM by a telemedicine application and verified that I am speaking with the correct person using two identifiers.  Location: Patient: home Provider: home   I discussed the limitations of evaluation and management by telemedicine and the availability of in person appointments. The patient expressed understanding and agreed to proceed.   Shared Decision Making Visit Lung Cancer Screening Program [phone])   Eligibility: 74 y.o. Pack Years Smoking History Calculation = 91 pack years  (# packs/per year x # years smoked) Recent History of coughing up blood  no Unexplained weight loss? no ( >Than 15 pounds within the last 6 months ) Prior History Lung / other cancer no (Diagnosis within the last 5 years already requiring surveillance chest CT Scans). Smoking Status Current Smoker  Visit Components: Discussion included one or more decision making aids. YES Discussion included risk/benefits of screening. YES Discussion included potential follow up diagnostic testing for abnormal scans. YES Discussion included meaning and risk of over diagnosis. YES Discussion included meaning and risk of False Positives. YES Discussion included meaning of total radiation exposure. YES  Counseling Included: Importance of adherence to annual lung cancer LDCT screening. YES Impact of comorbidities on ability to participate in the program. YES Ability and willingness to under diagnostic treatment. YES  Smoking Cessation Counseling: Current Smokers:  Discussed importance of smoking cessation. yes Information about tobacco cessation classes and interventions provided to patient. yes Patient provided with ticket for LDCT Scan. yes Symptomatic Patient. NO Diagnosis Code: Tobacco Use Z72.0 Asymptomatic Patient yes  Counseling - 4 minutes of smoking cessation counseling (CT Chest Lung Cancer Screening Low Dose W/O CM)  [id number]  Smoking/Tobacco Cessation Counseling Andrew Mercer is a current user of tobacco or nicotine products. He is not ready to quit at this time. Counseling provided today addressed the risks of continued use and the benefits of cessation. Discussed tobacco/nicotine use history, readiness to quit, and evidence-based treatment options including behavioral strategies, support resources, and pharmacologic therapies. Provided encouragement and educational materials on steps and resources to quit smoking. Patient questions were addressed, and follow-up recommended for continued support. Total time spent on counseling: 3 minutes.    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

## 2024-05-17 NOTE — Patient Instructions (Signed)

## 2024-05-31 ENCOUNTER — Ambulatory Visit (HOSPITAL_COMMUNITY)
